# Patient Record
Sex: Male | Born: 1955 | Race: White | Hispanic: No | Marital: Married | Smoking: Never smoker
Health system: Southern US, Community
[De-identification: ages and names within clinical notes are randomized; demographics above are authoritative.]

## PROBLEM LIST (undated history)

## (undated) DIAGNOSIS — J349 Unspecified disorder of nose and nasal sinuses: Secondary | ICD-10-CM

## (undated) DIAGNOSIS — G4730 Sleep apnea, unspecified: Secondary | ICD-10-CM

## (undated) DIAGNOSIS — B019 Varicella without complication: Secondary | ICD-10-CM

## (undated) DIAGNOSIS — T7840XA Allergy, unspecified, initial encounter: Secondary | ICD-10-CM

## (undated) DIAGNOSIS — K635 Polyp of colon: Secondary | ICD-10-CM

## (undated) DIAGNOSIS — M1990 Unspecified osteoarthritis, unspecified site: Secondary | ICD-10-CM

## (undated) DIAGNOSIS — E785 Hyperlipidemia, unspecified: Secondary | ICD-10-CM

## (undated) DIAGNOSIS — K219 Gastro-esophageal reflux disease without esophagitis: Secondary | ICD-10-CM

## (undated) HISTORY — DX: Unspecified disorder of nose and nasal sinuses: J34.9

## (undated) HISTORY — DX: Allergy, unspecified, initial encounter: T78.40XA

## (undated) HISTORY — DX: Polyp of colon: K63.5

## (undated) HISTORY — DX: Gastro-esophageal reflux disease without esophagitis: K21.9

## (undated) HISTORY — DX: Varicella without complication: B01.9

## (undated) HISTORY — PX: OTHER SURGICAL HISTORY: SHX169

## (undated) HISTORY — DX: Unspecified osteoarthritis, unspecified site: M19.90

## (undated) HISTORY — DX: Sleep apnea, unspecified: G47.30

## (undated) HISTORY — DX: Hyperlipidemia, unspecified: E78.5

---

## 2014-11-23 ENCOUNTER — Encounter: Payer: Self-pay | Admitting: Internal Medicine

## 2014-11-23 ENCOUNTER — Other Ambulatory Visit: Payer: BLUE CROSS/BLUE SHIELD

## 2014-11-23 ENCOUNTER — Encounter (INDEPENDENT_AMBULATORY_CARE_PROVIDER_SITE_OTHER): Payer: Self-pay

## 2014-11-23 ENCOUNTER — Ambulatory Visit (INDEPENDENT_AMBULATORY_CARE_PROVIDER_SITE_OTHER): Payer: BLUE CROSS/BLUE SHIELD | Admitting: Internal Medicine

## 2014-11-23 VITALS — BP 108/84 | HR 80 | Ht 71.0 in | Wt 211.0 lb

## 2014-11-23 DIAGNOSIS — K219 Gastro-esophageal reflux disease without esophagitis: Secondary | ICD-10-CM

## 2014-11-23 DIAGNOSIS — G4733 Obstructive sleep apnea (adult) (pediatric): Secondary | ICD-10-CM

## 2014-11-23 DIAGNOSIS — J309 Allergic rhinitis, unspecified: Secondary | ICD-10-CM

## 2014-11-23 DIAGNOSIS — J3089 Other allergic rhinitis: Principal | ICD-10-CM

## 2014-11-23 DIAGNOSIS — Z91018 Allergy to other foods: Secondary | ICD-10-CM

## 2014-11-23 DIAGNOSIS — Z23 Encounter for immunization: Secondary | ICD-10-CM

## 2014-11-23 DIAGNOSIS — Z91038 Other insect allergy status: Secondary | ICD-10-CM | POA: Insufficient documentation

## 2014-11-23 DIAGNOSIS — J302 Other seasonal allergic rhinitis: Secondary | ICD-10-CM

## 2014-11-23 NOTE — Assessment & Plan Note (Signed)
Clarification needed. He attributes significant improvement in upper respiratory symptoms to initiation of allergy vaccine 2 years ago and wishes to continue through this office since [place]. We need clarification from outside records. Plan-we will seek appropriate records and he will bring his remaining vaccine. We can administer outside vaccine here and then replace it with our own based on previous skin testing.

## 2014-11-23 NOTE — Progress Notes (Signed)
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 Carolyne Fiscal for allergies wants to transfer to [facility], Pt also has Sleep Apnea treated by Dr. Enid Skeens.  He has moved from [place] to [place] and is trying to transfer some of his care. Asks help getting a primary physician. In particular he wants to transfer his allergy vaccine management. He is satisfied to continue management of his obstructive sleep apnea through [facility] in [place]. Plan-history of recurrent sinus infections and bronchitis as an adult. He doesn't remember being diagnosed with allergic rhinitis or asthma. He describes "huge" improvement in the 2 years that he has been on allergy vaccine with reduced sinus infections. He does use saline rinse. Allergy skin tested twice, most recently in [place] 2 years ago "reactive to almost everything". Recognize triggers described as fall more than spring pollen season. Dry cough persistent each fall, much worse before he began allergy shots. 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 Has EpiPen. No ENT surgery. Remote nasal fracture did not require resetting. Says he was a premature baby and lungs "never good". Ran track in high school trying to compensate for this. One episode of mild pneumonia. GERD: He did use Prilosec but stopped when he began reading adverse reports about PPI's.  Environment: Lives with wife and 3 dogs church property [place] Would like flu shot  Prior to Admission medications   Medication Sig Start Date End Date Taking? Authorizing Provider  ALOE VERA JUICE PO Take by mouth. Three times daily   Yes Historical Provider, MD  cholecalciferol (VITAMIN D) 1000 UNITS tablet Take 1,000 Units by mouth daily.   Yes Historical Provider, MD  methylphenidate (RITALIN) 10 MG tablet Take 10 mg by mouth.  10/07/14  Yes Historical Provider, MD  NON FORMULARY DGL Licorice - 1 tablet three times daily   Yes Historical Provider, MD  Probiotic Product (PROBIOTIC DAILY PO) Take by mouth. Twice a day   Yes Historical Provider, MD  UNABLE TO FIND Slippery Elm - 2 times daily   Yes Historical Provider, MD  vitamin B-12 (CYANOCOBALAMIN) 100 MCG tablet Take 100 mcg by mouth daily.   Yes Historical Provider, MD   Past Medical History  Diagnosis Date  . Sleep apnea   . Allergy   . Sinus trouble    Past Surgical History  Procedure Laterality Date  . Cartlage removed      Right knee   Family History  Problem Relation Age of Onset  . Diabetes Maternal Aunt   . Lung cancer Father   . Peripheral Artery Disease Mother   . Macular degeneration Mother   . Heart disease Maternal Grandfather   . Heart disease Paternal Grandfather    History   Social History  . Marital Status: Married    Spouse Name: N/A    Number of Children: 1  . Years of Education: N/A   Occupational History  . Minister    Social History Main Topics  . Smoking status: Never Smoker   . Smokeless tobacco: Not on file  . Alcohol Use: 0.0 oz/week    0 Not specified per week     Comment: 2 drinks monthly  . Drug Use: No  . Sexual Activity: Not on file   Other Topics Concern  . Not on file   Social History Narrative  . No narrative on file  ROS-see HPI Constitutional:   No-   weight loss, night sweats, fevers, chills, fatigue, lassitude. HEENT:   No-  headaches, difficulty swallowing, tooth/dental problems, sore throat,      + sneezing, itching, ear ache, +nasal congestion, post nasal drip,  CV:  No-   chest pain, orthopnea, PND, swelling in lower extremities, anasarca,                                  dizziness, palpitations Resp: No-   shortness of breath with exertion or at rest.              No-   productive cough,  + non-productive cough,  No- coughing up of blood.              No-   change in color of mucus.  No-  wheezing.   Skin: No-   rash or lesions. GI:  No-   heartburn, indigestion, abdominal pain, nausea, vomiting, diarrhea,                 change in bowel habits, loss of appetite GU: No-   dysuria, change in color of urine, no urgency or frequency.  No- flank pain. MS:  No-   joint pain or swelling.  No- decreased range of motion.  No- back pain. Neuro-     nothing unusual Psych:  No- change in mood or affect. No depression or anxiety.  No memory loss.  OBJ- Physical Exam General- Alert, Oriented, Affect-appropriate, Distress- none acute Skin- rash-none, lesions- none, excoriation- none Lymphadenopathy- none Head- atraumatic            Eyes- Gross vision intact, PERRLA, conjunctivae and secretions clear            Ears- Hearing, canals-normal            Nose- Clear, no-Septal dev, mucus, polyps, erosion, perforation             Throat- Mallampati II , mucosa clear , drainage- none, tonsils- atrophic Neck- flexible , trachea midline, no stridor , thyroid nl, carotid no bruit Chest - symmetrical excursion , unlabored           Heart/CV- RRR , no murmur , no gallop  , no rub, nl s1 s2                           - JVD- none , edema- none, stasis changes- none, varices- none           Lung- clear to P&A, wheeze- none, cough- none , dullness-none, rub- none           Chest wall-  Abd- tender-no, distended-no, bowel sounds-present, HSM- no Br/ Gen/ Rectal- Not done, not indicated Extrem- cyanosis- none, clubbing, none, atrophy- none, strength- nl Neuro- grossly intact to observation

## 2014-11-23 NOTE — Assessment & Plan Note (Signed)
CPAP unknown pressure/Aero care managed through [facility] [place]. He feels adequately controlled and managed for now. He is not seeking to transfer that care now.

## 2014-11-23 NOTE — Assessment & Plan Note (Signed)
He describes anaphylactic reaction to yellow jacket sting with throat tightness and shortness of breath for which she went to emergency room twice. He has EpiPen. Plan-educated on H1 and H2 antihistamines, stinging insect allergy profile

## 2014-11-23 NOTE — Assessment & Plan Note (Signed)
He thinks certain foods cause sneezing and nasal congestion but he is unclear which foods. Plan-food IgE profile

## 2014-11-23 NOTE — Patient Instructions (Addendum)
We will need from your current allergy doctor: Your skin test record The list of what is in your allergy vaccine The bottles of remaining vaccine- Unless you prefer to finish those up at your current office.  When your current vaccine supply is used up, we will make a vaccine here based on what you have been getting.  Zantac, Tagamet and Pepcid are otc antihistamines (H2) that are marketed as acid blockers, but don't work by the same chemistry as the Proton Pump Inhibitors.  Consider trying a brick under each of the head legs of your bed frame to reduce reflux at night.  Order-lab- Hymenoptera/ Stinging Insect Profile     Dx Allergic Rhinitis                   Food IgE profile  Order- [facility]- Please try referral to establish with Primary Care   Order- flu vax

## 2014-11-23 NOTE — Assessment & Plan Note (Signed)
History of reflux. He had been well-controlled with Prilosec but stopped that some months ago after reading about concerns with long-term use of PPIs. Plan-educated on use of H2 antihistamines, elevation of head of bed, reflux precautions instead of depending on PPI. He needs to establish  primary physician and that we'll help with long-term management decisions.

## 2014-11-24 LAB — ALLERGEN HYMENOPTERA PANEL
ALLERGEN PAPER WASP: 1.45 kU/L — AB
ALLERGEN WHITE HORNET: 1.22 kU/L — AB
ALLERGEN YELLOW JACKET: 3.37 kU/L — AB
Honey Bee IgE: 0.38 kU/L — ABNORMAL HIGH
Yellow Hornet IgE: 0.61 kU/L — ABNORMAL HIGH

## 2014-11-24 LAB — ALLERGEN FOOD PROFILE SPECIFIC IGE
Apple: <0.1 kU/L
Chicken IgE: <0.1 kU/L
Egg White IgE: <0.1 kU/L
Fish Cod: <0.1 kU/L
IgE (Immunoglobulin E), Serum: 42 kU/L (ref <115)
Milk IgE: 0.39 kU/L — ABNORMAL HIGH
Orange: <0.1 kU/L
Shrimp IgE: <0.1 kU/L
Soybean IgE: <0.1 kU/L
Tuna IgE: <0.1 kU/L

## 2014-12-24 ENCOUNTER — Ambulatory Visit (INDEPENDENT_AMBULATORY_CARE_PROVIDER_SITE_OTHER): Payer: BLUE CROSS/BLUE SHIELD | Admitting: Family

## 2014-12-24 ENCOUNTER — Encounter: Payer: Self-pay | Admitting: Family

## 2014-12-24 ENCOUNTER — Other Ambulatory Visit: Payer: Self-pay | Admitting: Family

## 2014-12-24 VITALS — BP 132/88 | HR 70 | Temp 98.2°F | Resp 18 | Ht 71.0 in | Wt 209.8 lb

## 2014-12-24 DIAGNOSIS — J309 Allergic rhinitis, unspecified: Secondary | ICD-10-CM

## 2014-12-24 DIAGNOSIS — Z0000 Encounter for general adult medical examination without abnormal findings: Secondary | ICD-10-CM

## 2014-12-24 DIAGNOSIS — K219 Gastro-esophageal reflux disease without esophagitis: Secondary | ICD-10-CM

## 2014-12-24 DIAGNOSIS — J302 Other seasonal allergic rhinitis: Secondary | ICD-10-CM

## 2014-12-24 DIAGNOSIS — E785 Hyperlipidemia, unspecified: Secondary | ICD-10-CM | POA: Insufficient documentation

## 2014-12-24 DIAGNOSIS — J3089 Other allergic rhinitis: Principal | ICD-10-CM

## 2014-12-24 NOTE — Assessment & Plan Note (Signed)
Previously noted high cholesterol readings. Will obtain new lipid profile during physical. Continue current lifestyle treatments.

## 2014-12-24 NOTE — Patient Instructions (Addendum)
Thank you for choosing [facility].  Summary/Instructions:  Dr. Ottis StainJoel Furhman "Eat to Live"  Please continue to take the herbal remedies for GERD.   Please schedule a time for your physical.   If your symptoms worsen or fail to improve, please contact our office for further instruction, or in case of emergency go directly to the emergency room at the closest medical facility.    Food Choices for Gastroesophageal Reflux Disease When you have gastroesophageal reflux disease (GERD), the foods you eat and your eating habits are very important. Choosing the right foods can help ease the discomfort of GERD. WHAT GENERAL GUIDELINES DO I NEED TO FOLLOW?  Choose fruits, vegetables, whole grains, low-fat dairy products, and low-fat meat, fish, and poultry.  Limit fats such as oils, salad dressings, butter, nuts, and avocado.  Keep a food diary to identify foods that cause symptoms.  Avoid foods that cause reflux. These may be different for different people.  Eat frequent small meals instead of three large meals each day.  Eat your meals slowly, in a relaxed setting.  Limit fried foods.  Cook foods using methods other than frying.  Avoid drinking alcohol.  Avoid drinking large amounts of liquids with your meals.  Avoid bending over or lying down until 2-3 hours after eating. WHAT FOODS ARE NOT RECOMMENDED? The following are some foods and drinks that may worsen your symptoms: Vegetables Tomatoes. Tomato juice. Tomato and spaghetti sauce. Chili peppers. Onion and garlic. Horseradish. Fruits Oranges, grapefruit, and lemon (fruit and juice). Meats High-fat meats, fish, and poultry. This includes hot dogs, ribs, ham, sausage, salami, and bacon. Dairy Whole milk and chocolate milk. Sour cream. Cream. Butter. Ice cream. Cream cheese.  Beverages Coffee and tea, with or without caffeine. Carbonated beverages or energy drinks. Condiments Hot sauce. Barbecue sauce.   Sweets/Desserts Chocolate and cocoa. Donuts. Peppermint and spearmint. Fats and Oils High-fat foods, including Jamaica[place] fries and potato chips. Other Vinegar. Strong spices, such as black pepper, white pepper, red pepper, cayenne, curry powder, cloves, ginger, and chili powder. The items listed above may not be a complete list of foods and beverages to avoid. Contact your dietitian for more information. Document Released: 10/22/2005 Document Revised: 10/27/2013 Document Reviewed: 08/26/2013 [facility] Patient Information 2015 [place] [place]. This information is not intended to replace advice given to you by your health care provider. Make sure you discuss any questions you have with your health care provider.

## 2014-12-24 NOTE — Assessment & Plan Note (Signed)
Patient continues to experience some reflux off of his Prilosec. Continue herbal medications as needed. Discussed with patient that herbal remedies are not regulated by the FDA. Continue ranitidine at previously prescribed dose. Patient provided handout regarding GERD foods.

## 2014-12-24 NOTE — Progress Notes (Signed)
Pre visit review using our clinic review tool, if applicable. No additional management support is needed unless otherwise documented below in the visit note. 

## 2014-12-24 NOTE — Assessment & Plan Note (Signed)
Stable and maintained with allergist and Dr. Maple HudsonYoung.

## 2014-12-24 NOTE — Progress Notes (Signed)
Subjective:    Patient ID: William Bauer, male    DOB: 02-26-1956, 59 y.o.   MRN: [id number]  Chief Complaint  Patient presents with  . Establish Care    would like to dicuss acid reflux and thinks he may have a high cholesterol    HPI:  William Bauer is a 59 y.o. male who presents today to establish care and discuss acid reflux and cholesterol.  1) Acid reflux - Associated symptom of acid reflux has been going on for about 14 years. Has previously taken prilosec which was effective, however recently discontinued to trial homeopathic medications. Is currently taking ranitidine which also seems to help. Describes non-pharmacology of decreasing eating late at night, spicy foods, and sitting up after meals. Indicates that it is fai  2) Cholesterol - indicates he has previously had high cholesterol reading. Is not current maintained on any medications.  3) Seasonal allergies - currently stable and maintained with allergy shots and Dr. Maple Hudson of Pulmonology.   Allergies  Allergen Reactions  . Penicillins     Unknown reaction  . Yellow Jacket Venom [Bee Venom]     Current Outpatient Prescriptions on File Prior to Visit  Medication Sig Dispense Refill  . ALOE VERA JUICE PO Take by mouth. Three times daily    . cholecalciferol (VITAMIN D) 1000 UNITS tablet Take 1,000 Units by mouth daily.    . methylphenidate (RITALIN) 10 MG tablet Take 10 mg by mouth.    . NON FORMULARY DGL Licorice - 1 tablet three times daily    . Probiotic Product (PROBIOTIC DAILY PO) Take by mouth. Twice a day    . UNABLE TO FIND Slippery Elm - 2 times daily    . vitamin B-12 (CYANOCOBALAMIN) 100 MCG tablet Take 100 mcg by mouth daily.     No current facility-administered medications on file prior to visit.    Past Medical History  Diagnosis Date  . Sleep apnea   . Allergy   . Sinus trouble   . Arthritis     hands   . Chicken pox   . GERD (gastroesophageal reflux disease)   . Hyperlipidemia   .  Colon polyps     Past Surgical History  Procedure Laterality Date  . Cartlage removed      Right knee    Family History  Problem Relation Age of Onset  . Diabetes Maternal Aunt   . Breast cancer Maternal Aunt   . Lung cancer Father   . Hyperlipidemia Father   . Peripheral Artery Disease Mother   . Macular degeneration Mother   . Arthritis Mother   . Hyperlipidemia Mother   . Stroke Mother   . Hypertension Mother   . Heart disease Maternal Grandfather   . Hyperlipidemia Maternal Grandfather   . Heart disease Paternal Grandfather   . Hyperlipidemia Paternal Grandfather   . Alcohol abuse Maternal Uncle     History   Social History  . Marital Status: Married    Spouse Name: N/A  . Number of Children: 1  . Years of Education: 39   Occupational History  . Minister    Social History Main Topics  . Smoking status: Never Smoker   . Smokeless tobacco: Never Used  . Alcohol Use: 0.0 oz/week    0 Standard drinks or equivalent per week     Comment: 2 drinks monthly  . Drug Use: No  . Sexual Activity: Not on file   Other Topics Concern  . Not  on file   Social History Narrative   [place] Currently resides in a house. 3 dogs. Fun: Yardwork, golf, read   Denies any religious beliefs effecting health care.     Review of Systems  Respiratory: Negative for cough, chest tightness and shortness of breath.   Cardiovascular: Negative for chest pain, palpitations and leg swelling.  Neurological: Negative for headaches.      Objective:    BP 132/88 mmHg  Pulse 70  Temp(Src) 98.2 F (36.8 C) (Oral)  Resp 18  Ht 5\' 11"  (1.803 m)  Wt 209 lb 12.8 oz (95.165 kg)  BMI 29.27 kg/m2  SpO2 96% Nursing note and vital signs reviewed.  Physical Exam  Constitutional: He is oriented to person, place, and time. He appears well-developed and well-nourished. No distress.  Cardiovascular: Normal rate, regular rhythm, normal heart sounds and intact distal  pulses.   Pulmonary/Chest: Effort normal and breath sounds normal.  Neurological: He is alert and oriented to person, place, and time.  Skin: Skin is warm and dry.  Psychiatric: He has a normal mood and affect. His behavior is normal. Judgment and thought content normal.       Assessment & Plan:

## 2014-12-30 ENCOUNTER — Encounter: Payer: Self-pay | Admitting: Family

## 2014-12-30 ENCOUNTER — Ambulatory Visit (INDEPENDENT_AMBULATORY_CARE_PROVIDER_SITE_OTHER): Payer: BLUE CROSS/BLUE SHIELD | Admitting: Family

## 2014-12-30 ENCOUNTER — Other Ambulatory Visit (INDEPENDENT_AMBULATORY_CARE_PROVIDER_SITE_OTHER): Payer: BLUE CROSS/BLUE SHIELD

## 2014-12-30 VITALS — BP 140/102 | HR 77 | Temp 98.1°F | Wt 213.0 lb

## 2014-12-30 DIAGNOSIS — J019 Acute sinusitis, unspecified: Secondary | ICD-10-CM

## 2014-12-30 DIAGNOSIS — Z0000 Encounter for general adult medical examination without abnormal findings: Secondary | ICD-10-CM

## 2014-12-30 DIAGNOSIS — J0110 Acute frontal sinusitis, unspecified: Secondary | ICD-10-CM | POA: Insufficient documentation

## 2014-12-30 DIAGNOSIS — E785 Hyperlipidemia, unspecified: Secondary | ICD-10-CM

## 2014-12-30 LAB — LIPID PANEL
CHOL/HDL RATIO: 4
Cholesterol: 193 mg/dL (ref 0–200)
HDL: 45.3 mg/dL (ref >39.00)
LDL Cholesterol: 112 mg/dL — ABNORMAL HIGH (ref 0–99)
NONHDL: 147.7
Triglycerides: 179 mg/dL — ABNORMAL HIGH (ref 0.0–149.0)
VLDL: 35.8 mg/dL (ref 0.0–40.0)

## 2014-12-30 LAB — BASIC METABOLIC PANEL
BUN: 13 mg/dL (ref 6–23)
CHLORIDE: 105 meq/L (ref 96–112)
CO2: 30 mEq/L (ref 19–32)
Calcium: 9.3 mg/dL (ref 8.4–10.5)
Creatinine, Ser: 1.14 mg/dL (ref 0.40–1.50)
GFR: 70.44 mL/min (ref >60.00)
Glucose, Bld: 90 mg/dL (ref 70–99)
Potassium: 4.3 mEq/L (ref 3.5–5.1)
Sodium: 139 mEq/L (ref 135–145)

## 2014-12-30 LAB — CBC
HCT: 44.9 % (ref 39.0–52.0)
Hemoglobin: 15.4 g/dL (ref 13.0–17.0)
MCHC: 34.2 g/dL (ref 30.0–36.0)
MCV: 83.6 fl (ref 78.0–100.0)
Platelets: 252 10*3/uL (ref 150.0–400.0)
RBC: 5.37 Mil/uL (ref 4.22–5.81)
RDW: 13.3 % (ref 11.5–15.5)
WBC: 8.2 10*3/uL (ref 4.0–10.5)

## 2014-12-30 LAB — TSH: TSH: 2.1 u[IU]/mL (ref 0.35–4.50)

## 2014-12-30 MED ORDER — MOMETASONE FUROATE 50 MCG/ACT NA SUSP: 2.0000 | Freq: Every day | NASAL | Status: DC

## 2014-12-30 MED ORDER — DOXYCYCLINE HYCLATE 100 MG PO TABS: 100.0000 mg | ORAL_TABLET | Freq: Two times a day (BID) | ORAL | Status: DC

## 2014-12-30 NOTE — Progress Notes (Signed)
Pre visit review using our clinic review tool, if applicable. No additional management support is needed unless otherwise documented below in the visit note. 

## 2014-12-30 NOTE — Assessment & Plan Note (Signed)
Symptoms and exam consistent with bacterial sinusitis. Start doxycycline. Start Nasonex. Continue over-the-counter medications as needed for symptom relief. Follow-up if symptoms worsen or fail to improve.

## 2014-12-30 NOTE — Patient Instructions (Addendum)
Thank you for choosing [facility].  Summary/Instructions:  Your prescription(s) have been submitted to your pharmacy or been printed and provided for you. Please take as directed and contact our office if you believe you are having problem(s) with the medication(s) or have any questions.  If your symptoms worsen or fail to improve, please contact our office for further instruction, or in case of emergency go directly to the emergency room at the closest medical facility.   Sinusitis Sinusitis is redness, soreness, and inflammation of the paranasal sinuses. Paranasal sinuses are air pockets within the bones of your face (beneath the eyes, the middle of the forehead, or above the eyes). In healthy paranasal sinuses, mucus is able to drain out, and air is able to circulate through them by way of your nose. However, when your paranasal sinuses are inflamed, mucus and air can become trapped. This can allow bacteria and other germs to grow and cause infection. Sinusitis can develop quickly and last only a short time (acute) or continue over a long period (chronic). Sinusitis that lasts for more than 12 weeks is considered chronic.  CAUSES  Causes of sinusitis include:  Allergies.  Structural abnormalities, such as displacement of the cartilage that separates your nostrils (deviated septum), which can decrease the air flow through your nose and sinuses and affect sinus drainage.  Functional abnormalities, such as when the small hairs (cilia) that line your sinuses and help remove mucus do not work properly or are not present. SIGNS AND SYMPTOMS  Symptoms of acute and chronic sinusitis are the same. The primary symptoms are pain and pressure around the affected sinuses. Other symptoms include:  Upper toothache.  Earache.  Headache.  Bad breath.  Decreased sense of smell and taste.  A cough, which worsens when you are lying flat.  Fatigue.  Fever.  Thick drainage from your nose,  which often is green and may contain pus (purulent).  Swelling and warmth over the affected sinuses. DIAGNOSIS  Your health care provider will perform a physical exam. During the exam, your health care provider may:  Look in your nose for signs of abnormal growths in your nostrils (nasal polyps).  Tap over the affected sinus to check for signs of infection.  View the inside of your sinuses (endoscopy) using an imaging device that has a light attached (endoscope). If your health care provider suspects that you have chronic sinusitis, one or more of the following tests may be recommended:  Allergy tests.  Nasal culture. A sample of mucus is taken from your nose, sent to a lab, and screened for bacteria.  Nasal cytology. A sample of mucus is taken from your nose and examined by your health care provider to determine if your sinusitis is related to an allergy. TREATMENT  Most cases of acute sinusitis are related to a viral infection and will resolve on their own within 10 days. Sometimes medicines are prescribed to help relieve symptoms (pain medicine, decongestants, nasal steroid sprays, or saline sprays).  However, for sinusitis related to a bacterial infection, your health care provider will prescribe antibiotic medicines. These are medicines that will help kill the bacteria causing the infection.  Rarely, sinusitis is caused by a fungal infection. In theses cases, your health care provider will prescribe antifungal medicine. For some cases of chronic sinusitis, surgery is needed. Generally, these are cases in which sinusitis recurs more than 3 times per year, despite other treatments. HOME CARE INSTRUCTIONS   Drink plenty of water. Water helps   thin the mucus so your sinuses can drain more easily.  Use a humidifier.  Inhale steam 3 to 4 times a day (for example, sit in the bathroom with the shower running).  Apply a warm, moist washcloth to your face 3 to 4 times a day, or as directed  by your health care provider.  Use saline nasal sprays to help moisten and clean your sinuses.  Take medicines only as directed by your health care provider.  If you were prescribed either an antibiotic or antifungal medicine, finish it all even if you start to feel better. SEEK IMMEDIATE MEDICAL CARE IF:  You have increasing pain or severe headaches.  You have nausea, vomiting, or drowsiness.  You have swelling around your face.  You have vision problems.  You have a stiff neck.  You have difficulty breathing. MAKE SURE YOU:   Understand these instructions.  Will watch your condition.  Will get help right away if you are not doing well or get worse. Document Released: 10/22/2005 Document Revised: 03/08/2014 Document Reviewed: 11/06/2011 ExitCare Patient Information 2015 [employer]. This information is not intended to replace advice given to you by your health care provider. Make sure you discuss any questions you have with your health care provider.   

## 2014-12-30 NOTE — Progress Notes (Signed)
   Subjective:    Patient ID: William Bauer, male    DOB: 1956-02-13, 59 y.o.   MRN: [id number]   Chief Complaint  Patient presents with  . Sinusitis    HPI:  William Headlandnthony Prieto is a 59 y.o. male who presents today for an acute visit.  This is new problem. Associated symptoms of headache, congestion, purulent mucus, fever, malaise, sinus pressure and sore throat going on for about about a week. Has used sinus rinse which has helped minimally. Indicates that he has worsened over the course of week. Denies recent antibiotic use.   Allergies  Allergen Reactions  . Penicillins     Unknown reaction  . Yellow Jacket Venom [Bee Venom]     Current Outpatient Prescriptions on File Prior to Visit  Medication Sig Dispense Refill  . ALOE VERA JUICE PO Take by mouth. Three times daily    . cholecalciferol (VITAMIN D) 1000 UNITS tablet Take 1,000 Units by mouth daily.    . methylphenidate (RITALIN) 10 MG tablet Take 10 mg by mouth.    . NON FORMULARY DGL Licorice - 1 tablet three times daily    . Probiotic Product (PROBIOTIC DAILY PO) Take by mouth. Twice a day    . ranitidine (ZANTAC) 150 MG tablet Take 150 mg by mouth 2 (two) times daily.    Marland Kitchen. UNABLE TO FIND Slippery Elm - 2 times daily    . vitamin B-12 (CYANOCOBALAMIN) 100 MCG tablet Take 100 mcg by mouth daily.     No current facility-administered medications on file prior to visit.     Review of Systems  Constitutional: Positive for fever.  HENT: Positive for congestion, sinus pressure and sore throat.   Respiratory: Positive for cough. Negative for chest tightness and shortness of breath.   Neurological: Positive for headaches.      Objective:    BP 140/102 mmHg  Pulse 77  Temp(Src) 98.1 F (36.7 C) (Oral)  Wt 213 lb (96.616 kg)  SpO2 93% Nursing note and vital signs reviewed.   Physical Exam  Constitutional: He is oriented to person, place, and time. He appears well-developed and well-nourished. No distress.  HENT:    Right Ear: Hearing, tympanic membrane, external ear and ear canal normal.  Left Ear: Hearing, tympanic membrane, external ear and ear canal normal.  Nose: Right sinus exhibits maxillary sinus tenderness and frontal sinus tenderness. Left sinus exhibits maxillary sinus tenderness and frontal sinus tenderness.  Mouth/Throat: Uvula is midline, oropharynx is clear and moist and mucous membranes are normal.  Neck: Neck supple.  Cardiovascular: Normal rate, regular rhythm, normal heart sounds and intact distal pulses.   Pulmonary/Chest: Effort normal and breath sounds normal.  Lymphadenopathy:    He has no cervical adenopathy.  Neurological: He is alert and oriented to person, place, and time.  Skin: Skin is warm and dry.  Psychiatric: He has a normal mood and affect. His behavior is normal. Judgment and thought content normal.       Assessment & Plan:

## 2014-12-31 ENCOUNTER — Encounter: Payer: BLUE CROSS/BLUE SHIELD | Admitting: Family

## 2014-12-31 ENCOUNTER — Telehealth: Payer: Self-pay | Admitting: Family

## 2014-12-31 NOTE — Telephone Encounter (Signed)
Please inform the patient that his lab work is all within the expected ranges. His LDL or bad cholesterol and Triglycerides are slightly elevated, but nothing that requires any action at this time.

## 2014-12-31 NOTE — Telephone Encounter (Signed)
Left mess for patient to call back.  

## 2015-01-03 NOTE — Telephone Encounter (Signed)
Pt is aware of results. Would like PSA done at next visit if possible.

## 2015-01-04 NOTE — Telephone Encounter (Signed)
Noted  

## 2015-01-07 ENCOUNTER — Ambulatory Visit (INDEPENDENT_AMBULATORY_CARE_PROVIDER_SITE_OTHER): Payer: BLUE CROSS/BLUE SHIELD | Admitting: Family

## 2015-01-07 ENCOUNTER — Encounter: Payer: Self-pay | Admitting: Family

## 2015-01-07 VITALS — BP 142/90 | HR 74 | Temp 97.7°F | Resp 18 | Ht 71.0 in | Wt 214.4 lb

## 2015-01-07 DIAGNOSIS — Z23 Encounter for immunization: Secondary | ICD-10-CM | POA: Diagnosis not present

## 2015-01-07 DIAGNOSIS — Z0000 Encounter for general adult medical examination without abnormal findings: Secondary | ICD-10-CM | POA: Diagnosis not present

## 2015-01-07 NOTE — Progress Notes (Signed)
Subjective:    Patient ID: William Bauer, male    DOB: 07-May-1956, 59 y.o.   MRN: [id number]  Chief Complaint  Patient presents with  . CPE    Not fasting, had blood work done    HPI:  William Bauer is a 59 y.o. male who presents today for an annual wellness visit.   1) Health Maintenance -   Diet - Averages 3 meals per day; fair amounts of fruits and vegetables, lean meats; Limited processed/fast food. Average about 3 cups of caffeine per day.   Exercise - Resistance training / Cardio / Play golf; 4x per week.   2) Preventative Exams / Immunizations:  Dental -- Up to date Vision -- Up to date   Health Maintenance  Topic Date Due  . HIV Screening  04/01/1973  . TETANUS/TDAP  04/01/1977  . INFLUENZA VACCINE  06/06/2015  . COLONOSCOPY  11/19/2021  Teatnus shot.    Immunization History  Administered Date(s) Administered  . Influenza,inj,Quad PF,36+ Mos 11/23/2014    Review of Systems  Constitutional: Denies fever, chills, fatigue, or significant weight gain/loss. HENT: Head: Denies headache or neck pain Ears: Denies changes in hearing, ringing in ears, earache, drainage Nose: Denies discharge, stuffiness, itching, nosebleed, sinus pain Throat: Denies sore throat, hoarseness, dry mouth, sores, thrush Eyes: Denies loss/changes in vision, pain, redness, blurry/double vision, flashing lights Cardiovascular: Denies chest pain/discomfort, tightness, palpitations, shortness of breath with activity, difficulty lying down, swelling, sudden awakening with shortness of breath Respiratory: Denies shortness of breath, cough, sputum production, wheezing Gastrointestinal: Denies dysphasia, heartburn, change in appetite, nausea, change in bowel habits, rectal bleeding, constipation, diarrhea, yellow skin or eyes Genitourinary: Denies frequency, urgency, burning/pain, blood in urine, incontinence, change in urinary strength. Musculoskeletal: Denies muscle/joint pain,  stiffness, back pain, redness or swelling of joints, trauma Skin: Denies rashes, lumps, itching, dryness, color changes, or hair/nail changes Neurological: Denies dizziness, fainting, seizures, weakness, numbness, tingling, tremor Psychiatric - Denies nervousness, stress, depression or memory loss Endocrine: Denies heat or cold intolerance, sweating, frequent urination, excessive thirst, changes in appetite Hematologic: Denies ease of bruising or bleeding     Objective:    BP 142/90 mmHg  Pulse 74  Temp(Src) 97.7 F (36.5 C) (Oral)  Resp 18  Ht  (1.803 m)  Wt 214 lb 6.4 oz (97.251 kg)  BMI 29.92 kg/m2  SpO2 95% Nursing note and vital signs reviewed.  Physical Exam  Constitutional: He is oriented to person, place, and time. He appears well-developed and well-nourished.  HENT:  Head: Normocephalic.  Right Ear: Hearing, tympanic membrane, external ear and ear canal normal.  Left Ear: Hearing, tympanic membrane, external ear and ear canal normal.  Nose: Nose normal.  Mouth/Throat: Uvula is midline, oropharynx is clear and moist and mucous membranes are normal.  Eyes: Conjunctivae and EOM are normal. Pupils are equal, round, and reactive to light.  Neck: Neck supple. No JVD present. No tracheal deviation present. No thyromegaly present.  Cardiovascular: Normal rate, regular rhythm, normal heart sounds and intact distal pulses.   Pulmonary/Chest: Effort normal and breath sounds normal.  Abdominal: Soft. Bowel sounds are normal. He exhibits no distension and no mass. There is no tenderness. There is no rebound and no guarding.  Musculoskeletal: Normal range of motion. He exhibits no edema or tenderness.  Lymphadenopathy:    He has no cervical adenopathy.  Neurological: He is alert and oriented to person, place, and time. He has normal reflexes. No cranial nerve deficit. He exhibits normal  muscle tone. Coordination normal.  Skin: Skin is warm and dry.  Psychiatric: He has a  normal mood and affect. His behavior is normal. Judgment and thought content normal.       Assessment & Plan:

## 2015-01-07 NOTE — Patient Instructions (Signed)
Thank you for choosing [facility].  Summary/Instructions:   Please stop by the lab on the basement level of the building for your blood work. Your results will be released to MyChart (or called to you) after review, usually within 72 hours after test completion. If any changes need to be made, you will be notified at that same time.  Health Maintenance A healthy lifestyle and preventative care can promote health and wellness.  Maintain regular health, dental, and eye exams.  Eat a healthy diet. Foods like vegetables, fruits, whole grains, low-fat dairy products, and lean protein foods contain the nutrients you need and are low in calories. Decrease your intake of foods high in solid fats, added sugars, and salt. Get information about a proper diet from your health care provider, if necessary.  Regular physical exercise is one of the most important things you can do for your health. Most adults should get at least 150 minutes of moderate-intensity exercise (any activity that increases your heart rate and causes you to sweat) each week. In addition, most adults need muscle-strengthening exercises on 2 or more days a week.   Maintain a healthy weight. The body mass index (BMI) is a screening tool to identify possible weight problems. It provides an estimate of body fat based on height and weight. Your health care provider can find your BMI and can help you achieve or maintain a healthy weight. For males 20 years and older:  A BMI below 18.5 is considered underweight.  A BMI of 18.5 to 24.9 is normal.  A BMI of 25 to 29.9 is considered overweight.  A BMI of 30 and above is considered obese.  Maintain normal blood lipids and cholesterol by exercising and minimizing your intake of saturated fat. Eat a balanced diet with plenty of fruits and vegetables. Blood tests for lipids and cholesterol should begin at age 20 and be repeated every 5 years. If your lipid or cholesterol levels are  high, you are over age 50, or you are at high risk for heart disease, you may need your cholesterol levels checked more frequently.Ongoing high lipid and cholesterol levels should be treated with medicines if diet and exercise are not working.  If you smoke, find out from your health care provider how to quit. If you do not use tobacco, do not start.  Lung cancer screening is recommended for adults aged 55-80 years who are at high risk for developing lung cancer because of a history of smoking. A yearly low-dose CT scan of the lungs is recommended for people who have at least a 30-pack-year history of smoking and are current smokers or have quit within the past 15 years. A pack year of smoking is smoking an average of 1 pack of cigarettes a day for 1 year (for example, a 30-pack-year history of smoking could mean smoking 1 pack a day for 30 years or 2 packs a day for 15 years). Yearly screening should continue until the smoker has stopped smoking for at least 15 years. Yearly screening should be stopped for people who develop a health problem that would prevent them from having lung cancer treatment.  If you choose to drink alcohol, do not have more than 2 drinks per day. One drink is considered to be 12 oz (360 mL) of beer, 5 oz (150 mL) of wine, or 1.5 oz (45 mL) of liquor.  Avoid the use of street drugs. Do not share needles with anyone. Ask for help if you need   support or instructions about stopping the use of drugs.  High blood pressure causes heart disease and increases the risk of stroke. Blood pressure should be checked at least every 1-2 years. Ongoing high blood pressure should be treated with medicines if weight loss and exercise are not effective.  If you are 45-79 years old, ask your health care provider if you should take aspirin to prevent heart disease.  Diabetes screening involves taking a blood sample to check your fasting blood sugar level. This should be done once every 3 years  after age 45 if you are at a normal weight and without risk factors for diabetes. Testing should be considered at a younger age or be carried out more frequently if you are overweight and have at least 1 risk factor for diabetes.  Colorectal cancer can be detected and often prevented. Most routine colorectal cancer screening begins at the age of 50 and continues through age 75. However, your health care provider may recommend screening at an earlier age if you have risk factors for colon cancer. On a yearly basis, your health care provider may provide home test kits to check for hidden blood in the stool. A small camera at the end of a tube may be used to directly examine the colon (sigmoidoscopy or colonoscopy) to detect the earliest forms of colorectal cancer. Talk to your health care provider about this at age 50 when routine screening begins. A direct exam of the colon should be repeated every 5-10 years through age 75, unless early forms of precancerous polyps or small growths are found.  People who are at an increased risk for hepatitis B should be screened for this virus. You are considered at high risk for hepatitis B if:  You were born in a country where hepatitis B occurs often. Talk with your health care provider about which countries are considered high risk.  Your parents were born in a high-risk country and you have not received a shot to protect against hepatitis B (hepatitis B vaccine).  You have HIV or AIDS.  You use needles to inject street drugs.  You live with, or have sex with, someone who has hepatitis B.  You are a man who has sex with other men (MSM).  You get hemodialysis treatment.  You take certain medicines for conditions like cancer, organ transplantation, and autoimmune conditions.  Hepatitis C blood testing is recommended for all people born from 1945 through 1965 and any individual with known risk factors for hepatitis C.  Healthy men should no longer receive  prostate-specific antigen (PSA) blood tests as part of routine cancer screening. Talk to your health care provider about prostate cancer screening.  Testicular cancer screening is not recommended for adolescents or adult males who have no symptoms. Screening includes self-exam, a health care provider exam, and other screening tests. Consult with your health care provider about any symptoms you have or any concerns you have about testicular cancer.  Practice safe sex. Use condoms and avoid high-risk sexual practices to reduce the spread of sexually transmitted infections (STIs).  You should be screened for STIs, including gonorrhea and chlamydia if:  You are sexually active and are younger than 24 years.  You are older than 24 years, and your health care provider tells you that you are at risk for this type of infection.  Your sexual activity has changed since you were last screened, and you are at an increased risk for chlamydia or gonorrhea. Ask your health care   provider if you are at risk.  If you are at risk of being infected with HIV, it is recommended that you take a prescription medicine daily to prevent HIV infection. This is called pre-exposure prophylaxis (PrEP). You are considered at risk if:  You are a man who has sex with other men (MSM).  You are a heterosexual man who is sexually active with multiple partners.  You take drugs by injection.  You are sexually active with a partner who has HIV.  Talk with your health care provider about whether you are at high risk of being infected with HIV. If you choose to begin PrEP, you should first be tested for HIV. You should then be tested every 3 months for as long as you are taking PrEP.  Use sunscreen. Apply sunscreen liberally and repeatedly throughout the day. You should seek shade when your shadow is shorter than you. Protect yourself by wearing long sleeves, pants, a wide-brimmed hat, and sunglasses year round whenever you are  outdoors.  Tell your health care provider of new moles or changes in moles, especially if there is a change in shape or color. Also, tell your health care provider if a mole is larger than the size of a pencil eraser.  A one-time screening for abdominal aortic aneurysm (AAA) and surgical repair of large AAAs by ultrasound is recommended for men aged 65-75 years who are current or former smokers.  Stay current with your vaccines (immunizations). Document Released: 04/19/2008 Document Revised: 10/27/2013 Document Reviewed: 03/19/2011 ExitCare Patient Information 2015 [employer]. This information is not intended to replace advice given to you by your health care provider. Make sure you discuss any questions you have with your health care provider.   

## 2015-01-07 NOTE — Addendum Note (Signed)
Addended by: Mercer PodWRENN, Ayame Rena E on: 01/07/2015 03:40 PM   Modules accepted: Orders

## 2015-01-07 NOTE — Progress Notes (Signed)
Pre visit review using our clinic review tool, if applicable. No additional management support is needed unless otherwise documented below in the visit note. 

## 2015-01-07 NOTE — Assessment & Plan Note (Signed)
1) Anticipatory Guidance: Discussed importance of wearing a seatbelt while driving and not texting while driving; changing batteries in smoke detector at least once annually; wearing suntan lotion when outside; eating a balanced and moderate diet; getting physical activity at least 30 minutes per day.  2) Immunizations / Screenings / Labs:  Tetanus shot given today. All other immunizations are up-to-date per recommendations. Obtain PSA. All other screenings are up-to-date per recommendations. Labwork was previously completed.  Overall well exam. Patient has minimal risk factors at this time. Discussed risk factor of being overweight. Recommend decreasing 5-10% of body weight through increased nutrient density and increased physical activity. Reviewed blood work with patient. All blood work appears to be within normal limits. Follow-up prevention exam in one year.

## 2015-01-18 ENCOUNTER — Ambulatory Visit (INDEPENDENT_AMBULATORY_CARE_PROVIDER_SITE_OTHER): Payer: BLUE CROSS/BLUE SHIELD | Admitting: Internal Medicine

## 2015-01-18 ENCOUNTER — Encounter: Payer: Self-pay | Admitting: Internal Medicine

## 2015-01-18 VITALS — BP 122/70 | HR 79 | Ht 71.0 in | Wt 209.4 lb

## 2015-01-18 DIAGNOSIS — J309 Allergic rhinitis, unspecified: Secondary | ICD-10-CM

## 2015-01-18 DIAGNOSIS — G4733 Obstructive sleep apnea (adult) (pediatric): Secondary | ICD-10-CM

## 2015-01-18 DIAGNOSIS — J302 Other seasonal allergic rhinitis: Secondary | ICD-10-CM

## 2015-01-18 DIAGNOSIS — Z91018 Allergy to other foods: Secondary | ICD-10-CM

## 2015-01-18 DIAGNOSIS — Z91038 Other insect allergy status: Secondary | ICD-10-CM

## 2015-01-18 DIAGNOSIS — J3089 Other allergic rhinitis: Secondary | ICD-10-CM

## 2015-01-18 NOTE — Assessment & Plan Note (Addendum)
Avoidance remains dkey and he has had EpiPen and antihistamines. He expresses strong interest in Hymenoptera venom vaccine which I no longer provide at this office. I have suggested that he switch to an office which can provide that therapy, before getting started in [place] on allergy vaccine so that it all can be managed together.

## 2015-01-18 NOTE — Assessment & Plan Note (Addendum)
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 He will avoid foods that clearly cause problems.

## 2015-01-18 NOTE — Progress Notes (Signed)
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 Carolyne FiscalInman for allergies [facility] [place] [facility], Pt also has Sleep Apnea treated by Dr. Enid SkeenseFeo.  He has moved from [place] to [place] and is trying to transfer some of his care. Asks help getting a primary physician. In particular he wants to transfer his allergy vaccine management. He is satisfied to continue management of his obstructive sleep apnea through [facility] in [place]. Plan-history of recurrent sinus infections and bronchitis as an adult. He doesn't remember being diagnosed with allergic rhinitis or asthma. He describes "huge" improvement in the 2 years that he has been on allergy vaccine with reduced sinus infections. He does use saline rinse. Allergy skin tested twice, most [place] 2 years ago "reactive to almost everything". Recognize triggers described as fall more than spring pollen season. Dry cough persistent each fall, much worse before he began allergy shots. 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 Has EpiPen. No ENT surgery. Remote nasal fracture did not require resetting. Says he was a premature baby and lungs "never good". Ran track in high school trying to compensate for this. One episode of mild pneumonia. GERD: He did use Prilosec but stopped when he began reading adverse reports about PPI's.  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 Carolyne FiscalInman for allergies, transferred [place] [facility], Pt also has Sleep Apnea treated by Dr. Enid SkeenseFeo.  Follows For: Feeling well - Treated for sinus infection by pcp recently. His primary seasonal allergy problem is in the fall. 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 Would like to switch care here. I discussed availability of venom vaccine at other offices. We have not had enough demand for it here to maintain stocks. I have suggested, that before he would get started on allergy vaccine here, he might do better to switch to an office that could offer venom vaccine if that is what he wants to do.  ROS-see HPI Constitutional:   No-   weight loss, night sweats, fevers, chills, fatigue, lassitude. HEENT:   No-  headaches, difficulty swallowing, tooth/dental problems, sore throat,      + sneezing, itching, ear ache, +nasal congestion, post nasal drip,  CV:  No-   chest pain, orthopnea, PND, swelling in lower extremities, anasarca,                                  dizziness, palpitations Resp: No-   shortness of breath with exertion or at rest.              No-   productive cough,  + non-productive cough,  No- coughing up of blood.              No-   change in color of mucus.  No- wheezing.   Skin: No-   rash or lesions. GI:  No-   heartburn, indigestion, abdominal pain, nausea, vomiting,  GU: . MS:  No-   joint pain or swelling.  . Neuro-     nothing unusual Psych:  No- change  in mood or affect. No depression or anxiety.  No memory loss.  OBJ- Physical Exam General- Alert, Oriented, Affect-appropriate, Distress- none acute Skin- rash-none, lesions- none, excoriation- none Lymphadenopathy- none Head- atraumatic            Eyes- Gross vision intact, PERRLA, conjunctivae and secretions clear            Ears- Hearing, canals-normal            Nose- Clear, no-Septal dev, mucus, polyps, erosion, perforation             Throat- Mallampati II , mucosa clear , drainage- none, tonsils- atrophic Neck- flexible , trachea midline, no stridor , thyroid nl, carotid no bruit Chest - symmetrical excursion , unlabored           Heart/CV- RRR , no murmur  , no gallop  , no rub, nl s1 s2                           - JVD- none , edema- none, stasis changes- none, varices- none           Lung- clear to P&A, wheeze- none, cough- none , dullness-none, rub- none           Chest wall-  Abd-  Br/ Gen/ Rectal- Not done, not indicated Extrem- cyanosis- none, clubbing, none, atrophy- none, strength- nl Neuro- grossly intact to observation

## 2015-01-18 NOTE — Patient Instructions (Addendum)
This office no longer does stinging insect venom vaccination. Given the importance of this issue for you, I suggest you switch to one of the other allergy offices in town that does that work- Either [facility] [place], or [facility]- Drs Lucie LeatherKozlow, Geographical information systems [place] and [place] on [address] near [facility].  Either group would be able to take over your allergy vaccine management as appropriate

## 2015-01-18 NOTE — Assessment & Plan Note (Signed)
He wants to continue allergy vaccine from [place]. Because he is interested in Hymenoptera venom vaccine, I suggested that he change to an office that can provide both services if that is what he chooses to do.

## 2015-04-01 ENCOUNTER — Other Ambulatory Visit: Payer: Self-pay | Admitting: Orthopedic Surgery

## 2015-04-01 DIAGNOSIS — S6991XS Unspecified injury of right wrist, hand and finger(s), sequela: Secondary | ICD-10-CM

## 2015-04-20 ENCOUNTER — Other Ambulatory Visit: Payer: BLUE CROSS/BLUE SHIELD

## 2015-05-31 ENCOUNTER — Other Ambulatory Visit (INDEPENDENT_AMBULATORY_CARE_PROVIDER_SITE_OTHER): Payer: Managed Care, Other (non HMO)

## 2015-05-31 ENCOUNTER — Telehealth: Payer: Self-pay | Admitting: Family

## 2015-05-31 ENCOUNTER — Ambulatory Visit (INDEPENDENT_AMBULATORY_CARE_PROVIDER_SITE_OTHER): Payer: Managed Care, Other (non HMO) | Admitting: Family

## 2015-05-31 ENCOUNTER — Encounter: Payer: Self-pay | Admitting: Family

## 2015-05-31 VITALS — BP 120/84 | HR 69 | Temp 98.0°F | Resp 18 | Ht 71.0 in | Wt 207.0 lb

## 2015-05-31 DIAGNOSIS — Z0000 Encounter for general adult medical examination without abnormal findings: Secondary | ICD-10-CM | POA: Diagnosis not present

## 2015-05-31 DIAGNOSIS — J019 Acute sinusitis, unspecified: Secondary | ICD-10-CM | POA: Diagnosis not present

## 2015-05-31 LAB — PSA: PSA: 0.42 ng/mL (ref 0.10–4.00)

## 2015-05-31 MED ORDER — LEVOFLOXACIN 500 MG PO TABS: 500.0000 mg | ORAL_TABLET | Freq: Every day | ORAL | Status: DC

## 2015-05-31 MED ORDER — HYDROCODONE-HOMATROPINE 5-1.5 MG/5ML PO SYRP: 5.0000 mL | ORAL_SOLUTION | Freq: Three times a day (TID) | ORAL | Status: DC | PRN

## 2015-05-31 MED ORDER — CYCLOBENZAPRINE HCL 10 MG PO TABS: 10.0000 mg | ORAL_TABLET | Freq: Three times a day (TID) | ORAL | Status: DC | PRN

## 2015-05-31 NOTE — Telephone Encounter (Signed)
Please inform patient that his PSA is normal.

## 2015-05-31 NOTE — Patient Instructions (Signed)
Thank you for choosing [facility].  Summary/Instructions:  Your prescription(s) have been submitted to your pharmacy or been printed and provided for you. Please take as directed and contact our office if you believe you are having problem(s) with the medication(s) or have any questions.  If your symptoms worsen or fail to improve, please contact our office for further instruction, or in case of emergency go directly to the emergency room at the closest medical facility.   General Recommendations:    Please drink plenty of fluids.  Get plenty of rest   Sleep in humidified air  Use saline nasal sprays  Netti pot   OTC Medications:  Decongestants - helps relieve congestion   Flonase (generic fluticasone) or Nasacort (generic triamcinolone) - please make sure to use the "cross-over" technique at a 45 degree angle towards the opposite eye as opposed to straight up the nasal passageway.   Sudafed (generic pseudoephedrine - Note this is the one that is available behind the pharmacy counter); Products with phenylephrine (-PE) may also be used but is often not as effective as pseudoephedrine.   If you have HIGH BLOOD PRESSURE - Coricidin HBP; AVOID any product that is -D as this contains pseudoephedrine which may increase your blood pressure.  Afrin (oxymetazoline) every 6-8 hours for up to 3 days.   Allergies - helps relieve runny nose, itchy eyes and sneezing   Claritin (generic loratidine), Allegra (fexofenidine), or Zyrtec (generic cyrterizine) for runny nose. These medications should not cause drowsiness.  Note - Benadryl (generic diphenhydramine) may be used however may cause drowsiness  Cough -   Delsym or Robitussin (generic dextromethorphan)  Expectorants - helps loosen mucus to ease removal   Mucinex (generic guaifenesin) as directed on the package.  Headaches / General Aches   Tylenol (generic acetaminophen) - DO NOT EXCEED 3 grams (3,000 mg) in a 24  hour time period  Advil/Motrin (generic ibuprofen)   Sore Throat -   Salt water gargle   Chloraseptic (generic benzocaine) spray or lozenges / Sucrets (generic dyclonine)    Sinusitis Sinusitis is redness, soreness, and inflammation of the paranasal sinuses. Paranasal sinuses are air pockets within the bones of your face (beneath the eyes, the middle of the forehead, or above the eyes). In healthy paranasal sinuses, mucus is able to drain out, and air is able to circulate through them by way of your nose. However, when your paranasal sinuses are inflamed, mucus and air can become trapped. This can allow bacteria and other germs to grow and cause infection. Sinusitis can develop quickly and last only a short time (acute) or continue over a long period (chronic). Sinusitis that lasts for more than 12 weeks is considered chronic.  CAUSES  Causes of sinusitis include:  Allergies.  Structural abnormalities, such as displacement of the cartilage that separates your nostrils (deviated septum), which can decrease the air flow through your nose and sinuses and affect sinus drainage.  Functional abnormalities, such as when the small hairs (cilia) that line your sinuses and help remove mucus do not work properly or are not present. SIGNS AND SYMPTOMS  Symptoms of acute and chronic sinusitis are the same. The primary symptoms are pain and pressure around the affected sinuses. Other symptoms include:  Upper toothache.  Earache.  Headache.  Bad breath.  Decreased sense of smell and taste.  A cough, which worsens when you are lying flat.  Fatigue.  Fever.  Thick drainage from your nose, which often is green and may   contain pus (purulent).  Swelling and warmth over the affected sinuses. DIAGNOSIS  Your health care provider will perform a physical exam. During the exam, your health care provider may:  Look in your nose for signs of abnormal growths in your nostrils (nasal  polyps).  Tap over the affected sinus to check for signs of infection.  View the inside of your sinuses (endoscopy) using an imaging device that has a light attached (endoscope). If your health care provider suspects that you have chronic sinusitis, one or more of the following tests may be recommended:  Allergy tests.  Nasal culture. A sample of mucus is taken from your nose, sent to a lab, and screened for bacteria.  Nasal cytology. A sample of mucus is taken from your nose and examined by your health care provider to determine if your sinusitis is related to an allergy. TREATMENT  Most cases of acute sinusitis are related to a viral infection and will resolve on their own within 10 days. Sometimes medicines are prescribed to help relieve symptoms (pain medicine, decongestants, nasal steroid sprays, or saline sprays).  However, for sinusitis related to a bacterial infection, your health care provider will prescribe antibiotic medicines. These are medicines that will help kill the bacteria causing the infection.  Rarely, sinusitis is caused by a fungal infection. In theses cases, your health care provider will prescribe antifungal medicine. For some cases of chronic sinusitis, surgery is needed. Generally, these are cases in which sinusitis recurs more than 3 times per year, despite other treatments. HOME CARE INSTRUCTIONS   Drink plenty of water. Water helps thin the mucus so your sinuses can drain more easily.  Use a humidifier.  Inhale steam 3 to 4 times a day (for example, sit in the bathroom with the shower running).  Apply a warm, moist washcloth to your face 3 to 4 times a day, or as directed by your health care provider.  Use saline nasal sprays to help moisten and clean your sinuses.  Take medicines only as directed by your health care provider.  If you were prescribed either an antibiotic or antifungal medicine, finish it all even if you start to feel better. SEEK IMMEDIATE  MEDICAL CARE IF:  You have increasing pain or severe headaches.  You have nausea, vomiting, or drowsiness.  You have swelling around your face.  You have vision problems.  You have a stiff neck.  You have difficulty breathing. MAKE SURE YOU:   Understand these instructions.  Will watch your condition.  Will get help right away if you are not doing well or get worse. Document Released: 10/22/2005 Document Revised: 03/08/2014 Document Reviewed: 11/06/2011 Exit[employer] Patient Information 2015 [employer]. This information is not intended to replace advice given to you by your health care provider. Make sure you discuss any questions you have with your health care provider.   

## 2015-05-31 NOTE — Progress Notes (Signed)
Pre visit review using our clinic review tool, if applicable. No additional management support is needed unless otherwise documented below in the visit note. 

## 2015-05-31 NOTE — Progress Notes (Signed)
Subjective:    Patient ID: William Bauer, male    DOB: 08/03/1956, 59 y.o.   MRN: [id number]  Chief Complaint  Patient presents with  . Cough    sore throat that started on thursday, has had head and chest congestion, productive cough,     HPI:  William Bauer is a 59 y.o. male with a PMH of GERD, sleep apnea, and seasonal allergies who presents today for an acute office visit.    This is a new problem. Associated symptoms of sore throat, head congestion, chest congestion, and productive cough going on for 6 days. Modifying factors include flushing sinuses and OTC cough medicine and vitamins. Timing of symptoms are fairly constant throughout the day. Course of the symptoms has improved slightly and then worsened and now plateaued. Denies antibiotic use.    Allergies  Allergen Reactions  . Penicillins     Unknown reaction  . Yellow Jacket Venom [Bee Venom]     Current Outpatient Prescriptions on File Prior to Visit  Medication Sig Dispense Refill  . ALOE VERA JUICE PO Take by mouth. Three times daily    . cholecalciferol (VITAMIN D) 1000 UNITS tablet Take 1,000 Units by mouth daily.    . methylphenidate (RITALIN) 10 MG tablet Take 10 mg by mouth 2 (two) times daily with breakfast and lunch.     . mometasone (NASONEX) 50 MCG/ACT nasal spray Place 2 sprays into the nose daily. 17 g 2  . NON FORMULARY DGL Licorice - 1 tablet three times daily    . Probiotic Product (PROBIOTIC DAILY PO) Take by mouth. Twice a day    . ranitidine (ZANTAC) 150 MG tablet Take 150 mg by mouth 2 (two) times daily.    Marland Kitchen UNABLE TO FIND Slippery [place]m - 2 times daily    . vitamin B-12 (CYANOCOBALAMIN) 100 MCG tablet Take 100 mcg by mouth daily.     No current facility-administered medications on file prior to visit.    Review of Systems  Constitutional: Negative for fever and chills.  HENT: Positive for congestion, sinus pressure and sore throat. Negative for facial swelling.   Respiratory: Positive  for cough. Negative for chest tightness and shortness of breath.   Cardiovascular: Negative for chest pain.  Neurological: Positive for headaches.      Objective:    BP 120/84 mmHg  Pulse 69  Temp(Src) 98 F (36.7 C) (Oral)  Resp 18  Ht  (1.803 m)  Wt 207 lb (93.895 kg)  BMI 28.88 kg/m2  SpO2 96% Nursing note and vital signs reviewed.  Physical Exam  Constitutional: He is oriented to person, place, and time. He appears well-developed and well-nourished. No distress.  HENT:  Right Ear: Hearing, tympanic membrane, external ear and ear canal normal.  Left Ear: Hearing, tympanic membrane, external ear and ear canal normal.  Nose: Right sinus exhibits maxillary sinus tenderness and frontal sinus tenderness. Left sinus exhibits maxillary sinus tenderness and frontal sinus tenderness.  Mouth/Throat: Uvula is midline, oropharynx is clear and moist and mucous membranes are normal.  Cardiovascular: Normal rate, regular rhythm, normal heart sounds and intact distal pulses.   Pulmonary/Chest: Effort normal and breath sounds normal.  Neurological: He is alert and oriented to person, place, and time.  Skin: Skin is warm and dry.  Psychiatric: He has a normal mood and affect. His behavior is normal. Judgment and thought content normal.       Assessment & Plan:   Problem List Items Addressed This  Visit      Respiratory   Sinusitis, acute - Primary    Symptoms consistent with sinusitis. Start levofloxacin. Start hycodan as needed for cough and sleep. Continue over the counter medications as needed for symptom relief and supportive care. Follow up if symptoms worsen or do not improve.       Relevant Medications   levofloxacin (LEVAQUIN) 500 MG tablet   HYDROcodone-homatropine (HYCODAN) 5-1.5 MG/5ML syrup

## 2015-05-31 NOTE — Assessment & Plan Note (Signed)
Symptoms consistent with sinusitis. Start levofloxacin. Start hycodan as needed for cough and sleep. Continue over the counter medications as needed for symptom relief and supportive care. Follow up if symptoms worsen or do not improve.

## 2015-06-02 NOTE — Telephone Encounter (Signed)
Pt aware of results 

## 2015-09-06 ENCOUNTER — Ambulatory Visit (INDEPENDENT_AMBULATORY_CARE_PROVIDER_SITE_OTHER): Payer: Managed Care, Other (non HMO)

## 2015-09-06 DIAGNOSIS — Z23 Encounter for immunization: Secondary | ICD-10-CM | POA: Diagnosis not present

## 2016-03-08 ENCOUNTER — Encounter: Payer: Self-pay | Admitting: Family

## 2016-03-08 ENCOUNTER — Ambulatory Visit (INDEPENDENT_AMBULATORY_CARE_PROVIDER_SITE_OTHER): Payer: Managed Care, Other (non HMO) | Admitting: Family

## 2016-03-08 ENCOUNTER — Other Ambulatory Visit (INDEPENDENT_AMBULATORY_CARE_PROVIDER_SITE_OTHER): Payer: Managed Care, Other (non HMO)

## 2016-03-08 ENCOUNTER — Telehealth: Payer: Self-pay | Admitting: Family

## 2016-03-08 VITALS — BP 142/82 | HR 80 | Temp 98.3°F | Ht 71.0 in | Wt 206.0 lb

## 2016-03-08 DIAGNOSIS — G8929 Other chronic pain: Secondary | ICD-10-CM | POA: Diagnosis not present

## 2016-03-08 DIAGNOSIS — R5383 Other fatigue: Secondary | ICD-10-CM

## 2016-03-08 DIAGNOSIS — F329 Major depressive disorder, single episode, unspecified: Secondary | ICD-10-CM

## 2016-03-08 DIAGNOSIS — M549 Dorsalgia, unspecified: Secondary | ICD-10-CM

## 2016-03-08 DIAGNOSIS — F32A Depression, unspecified: Secondary | ICD-10-CM

## 2016-03-08 LAB — BASIC METABOLIC PANEL
BUN: 17 mg/dL (ref 6–23)
CHLORIDE: 104 meq/L (ref 96–112)
CO2: 29 mEq/L (ref 19–32)
Calcium: 9.5 mg/dL (ref 8.4–10.5)
Creatinine, Ser: 1.09 mg/dL (ref 0.40–1.50)
GFR: 73.87 mL/min (ref >60.00)
GLUCOSE: 98 mg/dL (ref 70–99)
POTASSIUM: 4.3 meq/L (ref 3.5–5.1)
Sodium: 139 mEq/L (ref 135–145)

## 2016-03-08 LAB — CBC WITH DIFFERENTIAL/PLATELET
BASOS ABS: 0 10*3/uL (ref 0.0–0.1)
Basophils Relative: 0.2 % (ref 0.0–3.0)
EOS ABS: 0.1 10*3/uL (ref 0.0–0.7)
Eosinophils Relative: 1.7 % (ref 0.0–5.0)
HCT: 46.4 % (ref 39.0–52.0)
Hemoglobin: 15.7 g/dL (ref 13.0–17.0)
LYMPHS ABS: 1.8 10*3/uL (ref 0.7–4.0)
Lymphocytes Relative: 22.5 % (ref 12.0–46.0)
MCHC: 33.9 g/dL (ref 30.0–36.0)
MCV: 84.5 fl (ref 78.0–100.0)
Monocytes Absolute: 0.6 10*3/uL (ref 0.1–1.0)
Monocytes Relative: 7.5 % (ref 3.0–12.0)
NEUTROS ABS: 5.5 10*3/uL (ref 1.4–7.7)
NEUTROS PCT: 68.1 % (ref 43.0–77.0)
PLATELETS: 282 10*3/uL (ref 150.0–400.0)
RBC: 5.49 Mil/uL (ref 4.22–5.81)
RDW: 13 % (ref 11.5–15.5)
WBC: 8.1 10*3/uL (ref 4.0–10.5)

## 2016-03-08 LAB — TSH: TSH: 1.6 u[IU]/mL (ref 0.35–4.50)

## 2016-03-08 MED ORDER — BUPROPION HCL ER (SR) 150 MG PO TB12: ORAL_TABLET | ORAL | Status: DC

## 2016-03-08 MED ORDER — CYCLOBENZAPRINE HCL 10 MG PO TABS: 10.0000 mg | ORAL_TABLET | Freq: Every evening | ORAL | Status: DC | PRN

## 2016-03-08 NOTE — Telephone Encounter (Signed)
Ok with me 

## 2016-03-08 NOTE — Patient Instructions (Addendum)
Please get lab work on the way down stairs.   Please schedule CPE. Please make follow-up appointment for depression within 4-6 weeks, hopefully we can combine this with CPE.  If there is no improvement in your symptoms, or if there is any worsening of symptoms, or if you have any additional concerns, please return for re-evaluation; or, if we are closed, consider going to the Emergency Room for evaluation if symptoms urgent.  Major Depressive Disorder Major depressive disorder is a mental illness. It also may be called clinical depression or unipolar depression. Major depressive disorder usually causes feelings of sadness, hopelessness, or helplessness. Some people with this disorder do not feel particularly sad but lose interest in doing things they used to enjoy (anhedonia). Major depressive disorder also can cause physical symptoms. It can interfere with work, school, relationships, and other normal everyday activities. The disorder varies in severity but is longer lasting and more serious than the sadness we all feel from time to time in our lives. Major depressive disorder often is triggered by stressful life events or major life changes. Examples of these triggers include divorce, loss of your job or home, a move, and the death of a family member or close friend. Sometimes this disorder occurs for no obvious reason at all. People who have family members with major depressive disorder or bipolar disorder are at higher risk for developing this disorder, with or without life stressors. Major depressive disorder can occur at any age. It may occur just once in your life (single episode major depressive disorder). It may occur multiple times (recurrent major depressive disorder). SYMPTOMS People with major depressive disorder have either anhedonia or depressed mood on nearly a daily basis for at least 2 weeks or longer. Symptoms of depressed mood include:  Feelings of sadness (blue or down in the dumps)  or emptiness.  Feelings of hopelessness or helplessness.  Tearfulness or episodes of crying (may be observed by others).  Irritability (children and adolescents). In addition to depressed mood or anhedonia or both, people with this disorder have at least four of the following symptoms:  Difficulty sleeping or sleeping too much.   Significant change (increase or decrease) in appetite or weight.   Lack of energy or motivation.  Feelings of guilt and worthlessness.   Difficulty concentrating, remembering, or making decisions.  Unusually slow movement (psychomotor retardation) or restlessness (as observed by others).   Recurrent wishes for death, recurrent thoughts of self-harm (suicide), or a suicide attempt. People with major depressive disorder commonly have persistent negative thoughts about themselves, other people, and the world. People with severe major depressive disorder may experiencedistorted beliefs or perceptions about the world (psychotic delusions). They also may see or hear things that are not real (psychotic hallucinations). DIAGNOSIS Major depressive disorder is diagnosed through an assessment by your health care provider. Your health care provider will ask aboutaspects of your daily life, such as mood,sleep, and appetite, to see if you have the diagnostic symptoms of major depressive disorder. Your health care provider may ask about your medical history and use of alcohol or drugs, including prescription medicines. Your health care provider also may do a physical exam and blood work. This is because certain medical conditions and the use of certain substances can cause major depressive disorder-like symptoms (secondary depression). Your health care provider also may refer you to a mental health specialist for further evaluation and treatment. TREATMENT It is important to recognize the symptoms of major depressive disorder and seek treatment. The  following treatments  can be prescribed for this disorder:   Medicine. Antidepressant medicines usually are prescribed. Antidepressant medicines are thought to correct chemical imbalances in the brain that are commonly associated with major depressive disorder. Other types of medicine may be added if the symptoms do not respond to antidepressant medicines alone or if psychotic delusions or hallucinations occur.  Talk therapy. Talk therapy can be helpful in treating major depressive disorder by providing support, education, and guidance. Certain types of talk therapy also can help with negative thinking (cognitive behavioral therapy) and with relationship issues that trigger this disorder (interpersonal therapy). A mental health specialist can help determine which treatment is best for you. Most people with major depressive disorder do well with a combination of medicine and talk therapy. Treatments involving electrical stimulation of the brain can be used in situations with extremely severe symptoms or when medicine and talk therapy do not work over time. These treatments include electroconvulsive therapy, transcranial magnetic stimulation, and vagal nerve stimulation.   This information is not intended to replace advice given to you by your health care provider. Make sure you discuss any questions you have with your health care provider.   Document Released: 02/16/2013 Document Revised: 11/12/2014 Document Reviewed: 02/16/2013 Elsevier Interactive Patient Education [employer]2016 [employer].

## 2016-03-08 NOTE — Progress Notes (Signed)
Subjective:    Patient ID: William Bauer, male    DOB: [birth year], 58 y.o.   MRN: [id number]   William Bauer is a 60 y.o. male who presents today for an acute visit.    HPI Comments: Patient here primarily for fatigue for past two months, worsening Concerned that it is a/w depression. Took Wellbutrin several years ago which help with depression. Describes feeling unmotivated. Hard to fall asleep and stay asleep. Worry about fatigue as now 2 years older than father who died from lung cancer. Family deaths over past couple of years have contributed to stress. Worries about daughter who is back in school.Denies changes in appetite, concentration, weight, anxiety. Patient also denies negative thoughts of hurting oneself or another person.Little exercise, yoga. Minister.  N/o h/o seizure, alcohol abuse.   Also complains of rash LE , chest, and between scrotum between anus which have resolved. Has been using soap with tea tree oil and latromine powder with improvement.   Chronic back pain, stable. Requests refill for Flexeril for occasional use. States he uses the medication approximately 3 times a month for back pain.  Due for endoscopy and plans to call GI. H/o GERD.    Past Medical History  Diagnosis Date  . Sleep apnea   . Allergy   . Sinus trouble   . Arthritis     hands   . Chicken pox   . GERD (gastroesophageal reflux disease)   . Hyperlipidemia   . Colon polyps    Allergies: Penicillins and Yellow jacket venom Current Outpatient Prescriptions on File Prior to Visit  Medication Sig Dispense Refill  . ALOE VERA JUICE PO Take by mouth. Three times daily    . cholecalciferol (VITAMIN D) 1000 UNITS tablet Take 1,000 Units by mouth daily.    . cyclobenzaprine (FLEXERIL) 10 MG tablet Take 1 tablet (10 mg total) by mouth 3 (three) times daily as needed for muscle spasms. 30 tablet 0  . HYDROcodone-homatropine (HYCODAN) 5-1.5 MG/5ML syrup Take 5 mLs by mouth every 8 (eight) hours  as needed for cough. 120 mL 0  . methylphenidate (RITALIN) 10 MG tablet Take 10 mg by mouth 2 (two) times daily with breakfast and lunch.     . mometasone (NASONEX) 50 MCG/ACT nasal spray Place 2 sprays into the nose daily. 17 g 2  . NON FORMULARY DGL Licorice - 1 tablet three times daily    . Probiotic Product (PROBIOTIC DAILY PO) Take by mouth. Twice a day    . ranitidine (ZANTAC) 150 MG tablet Take 150 mg by mouth 2 (two) times daily.    Marland Kitchen UNABLE TO FIND [place] - 2 times daily    . vitamin B-12 (CYANOCOBALAMIN) 100 MCG tablet Take 100 mcg by mouth daily.     No current facility-administered medications on file prior to visit.    Social History  Substance Use Topics  . Smoking status: Never Smoker   . Smokeless tobacco: Never Used  . Alcohol Use: 0.0 oz/week    0 Standard drinks or equivalent per week     Comment: 2 drinks monthly    Review of Systems  Constitutional: Positive for fatigue. Negative for fever, chills, appetite change and unexpected weight change.  HENT: Negative for congestion, ear pain, rhinorrhea, sinus pressure and sore throat.   Respiratory: Negative for cough, shortness of breath and wheezing.   Cardiovascular: Negative for chest pain and palpitations.  Gastrointestinal: Negative for nausea, vomiting and diarrhea.  Musculoskeletal: Negative for myalgias.  Skin: Positive for rash (resolving).  Neurological: Negative for headaches.  Psychiatric/Behavioral: Negative for suicidal ideas.      Objective:    BP 142/82 mmHg  Pulse 80  Temp(Src) 98.3 F (36.8 C) (Oral)  Ht 5\' 11"  (1.803 m)  Wt 206 lb (93.441 kg)  BMI 28.74 kg/m2  SpO2 98%   Physical Exam  Constitutional: He appears well-developed and well-nourished.  Cardiovascular: Regular rhythm and normal heart sounds.   Pulmonary/Chest: Effort normal and breath sounds normal. No respiratory distress. He has no wheezes. He has no rhonchi. He has no rales.  Lymphadenopathy:       Head (left  side): No submandibular and no preauricular adenopathy present.  Neurological: He is alert.  Skin: Skin is warm and dry. No rash noted.  A couple of discrete scabs noted bilateral lower extremities and left side of chest wall.   Psychiatric: He has a normal mood and affect. His speech is normal and behavior is normal.  Vitals reviewed.      Assessment & Plan:   1. Depression  - buPROPion (WELLBUTRIN SR) 150 MG 12 hr tablet; Take one tablet by mouth daily for 3 days, and then increase to one tablet by mouth twice daily.  Dispense: 60 tablet; Refill: 2  2. Fatigue due to depression   3. Chronic back pain Stable, refilled medication.  - cyclobenzaprine (FLEXERIL) 10 MG tablet; Take 1 tablet (10 mg total) by mouth at bedtime as needed for muscle spasms.  Dispense: 30 tablet; Refill:   4. Other fatigue Pending lab work - CBC with Differential/Platelet; Future - TSH; Future - Basic metabolic panel; Future  5. Rash - resolved  I have discontinued Mr. Evrard's levofloxacin. I am also having him maintain his methylphenidate, NON FORMULARY, ALOE VERA JUICE PO, UNABLE TO FIND, Probiotic Product (PROBIOTIC DAILY PO), cholecalciferol, vitamin B-12, ranitidine, mometasone, HYDROcodone-homatropine, cyclobenzaprine, Fluticasone-Salmeterol, and EPINEPHrine.   Meds ordered this encounter  Medications  . Fluticasone-Salmeterol (ADVAIR DISKUS) 100-50 MCG/DOSE AEPB    Sig: Inhale into the lungs.  Marland Kitchen. EPINEPHrine (EPIPEN 2-PAK) 0.3 mg/0.3 mL IJ SOAJ injection    Sig: Inject 0.3 mg into the muscle.     Start medications as prescribed and explained to patient on After Visit Summary ( AVS). Risks, benefits, and alternatives of the medications and treatment plan prescribed today were discussed, and patient expressed understanding.   Education regarding symptom management and diagnosis given to patient.   Follow-up:Plan follow-up as discussed or as needed if any worsening symptoms or change in  condition. No Follow-up on file.   Continue to follow with Jeanine Luzalone, Gregory, FNP for routine health maintenance.   Minette HeadlandAnthony Welden and I agreed with plan.   Rennie PlowmanMargaret Arnett, FNP  Total of 25 minutes spent with patient, greater than 50% of which was spent in discussion of depression.

## 2016-03-08 NOTE — Telephone Encounter (Signed)
[facility]  States Dr. Sherlie BanMcClain referred.  Please advise.

## 2016-03-08 NOTE — Progress Notes (Signed)
Pre visit review using our clinic review tool, if applicable. No additional management support is needed unless otherwise documented below in the visit note. 

## 2016-03-09 NOTE — Telephone Encounter (Signed)
Got scheduled  °

## 2016-03-12 ENCOUNTER — Telehealth: Payer: Self-pay | Admitting: Family

## 2016-03-12 NOTE — Telephone Encounter (Signed)
Patient called back.  I informed him of labs.

## 2016-04-02 ENCOUNTER — Encounter (HOSPITAL_COMMUNITY): Payer: Self-pay | Admitting: Emergency Medicine

## 2016-04-02 ENCOUNTER — Emergency Department (HOSPITAL_COMMUNITY)
Admission: EM | Admit: 2016-04-02 | Discharge: 2016-04-02 | Disposition: A | Discharge status: Home / Self Care | Payer: Managed Care, Other (non HMO) | Attending: Emergency Medicine | Admitting: Emergency Medicine

## 2016-04-02 ENCOUNTER — Emergency Department (HOSPITAL_COMMUNITY): Payer: Managed Care, Other (non HMO)

## 2016-04-02 DIAGNOSIS — S60312A Abrasion of left thumb, initial encounter: Secondary | ICD-10-CM | POA: Diagnosis not present

## 2016-04-02 DIAGNOSIS — Y929 Unspecified place or not applicable: Secondary | ICD-10-CM | POA: Diagnosis not present

## 2016-04-02 DIAGNOSIS — Y999 Unspecified external cause status: Secondary | ICD-10-CM | POA: Insufficient documentation

## 2016-04-02 DIAGNOSIS — Z79899 Other long term (current) drug therapy: Secondary | ICD-10-CM | POA: Diagnosis not present

## 2016-04-02 DIAGNOSIS — Y939 Activity, unspecified: Secondary | ICD-10-CM | POA: Insufficient documentation

## 2016-04-02 DIAGNOSIS — W311XXA Contact with metalworking machines, initial encounter: Secondary | ICD-10-CM | POA: Insufficient documentation

## 2016-04-02 MED ORDER — LIDOCAINE HCL (PF) 1 % IJ SOLN
30.0000 mL | Freq: Once | INTRAMUSCULAR | Status: AC
  Administered 2016-04-02: 30 mL via INTRADERMAL
  Filled 2016-04-02: qty 30

## 2016-04-02 NOTE — ED Notes (Signed)
Patient c/o left thumb injury s/t drill bit through thumb. Deep injury to left thumb, deep vasculature visible, underlying fascia visible. Distal thumb sensation intact.

## 2016-04-02 NOTE — ED Notes (Signed)
MD and PA at bedside.  

## 2016-04-02 NOTE — ED Notes (Signed)
Patient was alert, oriented and stable upon discharge. RN went over AVS and patient had no further questions.  

## 2016-04-02 NOTE — Discharge Instructions (Signed)
Keep your appointment you have with your primary care provider on June 1 to have your wound rechecked.  Return to the emergency department if you experience worsening pain, redness surrounding the wound, increased warmth surrounding the wound, purulent discharge, fever, chills, any signs of infection.  Wound Care Taking care of your wound properly can help to prevent pain and infection. It can also help your wound to heal more quickly.  HOW TO CARE FOR YOUR WOUND  Take or apply over-the-counter and prescription medicines only as told by your health care provider.  If you were prescribed antibiotic medicine, take or apply it as told by your health care provider. Do not stop using the antibiotic even if your condition improves.  Clean the wound each day or as told by your health care provider.  Wash the wound with mild soap and water.  Rinse the wound with water to remove all soap.  Pat the wound dry with a clean towel. Do not rub it.  There are many different ways to close and cover a wound. For example, a wound can be covered with stitches (sutures), skin glue, or adhesive strips. Follow instructions from your health care provider about:  How to take care of your wound.  When and how you should change your bandage (dressing).  When you should remove your dressing.  Removing whatever was used to close your wound.  Check your wound every day for signs of infection. Watch for:  Redness, swelling, or pain.  Fluid, blood, or pus.  Keep the dressing dry until your health care provider says it can be removed. Do not take baths, swim, use a hot tub, or do anything that would put your wound underwater until your health care provider approves.  Raise (elevate) the injured area above the level of your heart while you are sitting or lying down.  Do not scratch or pick at the wound.  Keep all follow-up visits as told by your health care provider. This is important. SEEK MEDICAL CARE  IF:  You received a tetanus shot and you have swelling, severe pain, redness, or bleeding at the injection site.  You have a fever.  Your pain is not controlled with medicine.  You have increased redness, swelling, or pain at the site of your wound.  You have fluid, blood, or pus coming from your wound.  You notice a bad smell coming from your wound or your dressing. SEEK IMMEDIATE MEDICAL CARE IF:  You have a red streak going away from your wound.   This information is not intended to replace advice given to you by your health care provider. Make sure you discuss any questions you have with your health care provider.   Document Released: 07/31/2008 Document Revised: 03/08/2015 Document Reviewed: 10/18/2014 Elsevier Interactive Patient Education [employer]2016 [employer]vier Inc.

## 2016-04-02 NOTE — ED Provider Notes (Signed)
CSN: [id number]     Arrival date & time 04/02/16  1158 History   First MD Initiated Contact with Patient 04/02/16 1351     Chief Complaint  Patient presents with  . Hand Injury     (Consider location/radiation/quality/duration/timing/severity/associated sxs/prior Treatment) HPI   Patient is a 60 year old male with a history of arthritis and GERD who presents the ED with an abrasion to his left thumb. Patient states when he was using a drill today he got distracted and the drill touched the pad of his left thumb which took off a significant amount of skin. Pt states throbbing, constant, non-radiating pain. He has not taken anything for the pain. Nothing makes it better. He had a difficult time controlling the bleeding. He denies numbness, weakness of his thumb hand or wrist.  Past Medical History  Diagnosis Date  . Sleep apnea   . Allergy   . Sinus trouble   . Arthritis     hands   . Chicken pox   . GERD (gastroesophageal reflux disease)   . Hyperlipidemia   . Colon polyps    Past Surgical History  Procedure Laterality Date  . Cartlage removed      Right knee   Family History  Problem Relation Age of Onset  . Diabetes Maternal Aunt   . Breast cancer Maternal Aunt   . Lung cancer Father   . Hyperlipidemia Father   . Peripheral Artery Disease Mother   . Macular degeneration Mother   . Arthritis Mother   . Hyperlipidemia Mother   . Stroke Mother   . Hypertension Mother   . Heart disease Maternal Grandfather   . Hyperlipidemia Maternal Grandfather   . Heart disease Paternal Grandfather   . Hyperlipidemia Paternal Grandfather   . Alcohol abuse Maternal Uncle    Social History  Substance Use Topics  . Smoking status: Never Smoker   . Smokeless tobacco: Never Used  . Alcohol Use: 0.0 oz/week    0 Standard drinks or equivalent per week     Comment: 2 drinks monthly    Review of Systems  Gastrointestinal: Negative for nausea and vomiting.  Skin: Positive for wound.   Neurological: Negative for dizziness, syncope, weakness and numbness.  Psychiatric/Behavioral: Negative for confusion.      Allergies  Penicillins and Yellow jacket venom  Home Medications   Prior to Admission medications   Medication Sig Start Date End Date Taking? Authorizing Provider  ALOE VERA JUICE PO Take 1 tablet by mouth 3 (three) times daily.    Yes Historical Provider, MD  buPROPion (WELLBUTRIN SR) 150 MG 12 hr tablet Take one tablet by mouth daily for 3 days, and then increase to one tablet by mouth twice daily. Patient taking differently: Take 150 mg by mouth 2 (two) times daily.  03/08/16  Yes Allegra Grana, FNP  cholecalciferol (VITAMIN D) 1000 UNITS tablet Take 1,000 Units by mouth daily.   Yes Historical Provider, MD  cyclobenzaprine (FLEXERIL) 10 MG tablet Take 1 tablet (10 mg total) by mouth at bedtime as needed for muscle spasms. 03/08/16  Yes Allegra Grana, FNP  EPINEPHrine (EPIPEN 2-PAK) 0.3 mg/0.3 mL IJ SOAJ injection Inject 0.3 mg into the muscle. 07/17/13  Yes Historical Provider, MD  Ginger, Zingiber officinalis, (GINGER PO) Take 2 tablets by mouth daily.   Yes Historical Provider, MD  methylphenidate 27 MG PO TB24 Take 27 mg by mouth daily.   Yes Historical Provider, MD  mometasone (NASONEX) 50 MCG/ACT nasal spray  Place 2 sprays into the nose daily. Patient taking differently: Place 2 sprays into the nose daily as needed (allergies.).  12/30/14  Yes Veryl SpeakGregory D Calone, FNP  NON FORMULARY DGL Licorice - 1 tablet three times daily   Yes Historical Provider, MD  ranitidine (ZANTAC) 150 MG tablet Take 150 mg by mouth daily as needed for heartburn.    Yes Historical Provider, MD  UNABLE TO FIND Slippery Elm - 2 times daily   Yes Historical Provider, MD  HYDROcodone-homatropine (HYCODAN) 5-1.5 MG/5ML syrup Take 5 mLs by mouth every 8 (eight) hours as needed for cough. Patient not taking: Reported on 04/02/2016 05/31/15   Veryl SpeakGregory D Calone, FNP   BP 134/109 mmHg  Pulse  86  Temp(Src) 98.3 F (36.8 C) (Oral)  Resp 20  SpO2 95% Physical Exam  Constitutional: He appears well-developed and well-nourished. No distress.  HENT:  Head: Normocephalic and atraumatic.  Eyes: Conjunctivae are normal.  Neck: Normal range of motion.  Pulmonary/Chest: Effort normal.  Musculoskeletal: Normal range of motion.  Normal AROM of bilateral hands and fingers. Pt is neurovascular intact distally  Neurological: He is alert. Coordination normal.  Skin: Skin is warm and dry.  2x3 cm deep abrasion noted to pad of left thumb. No underlying structures noted, bleeding controlled.  Psychiatric: He has a normal mood and affect. His behavior is normal.    ED Course  .Nerve Block Date/Time: 04/02/2016 3:09 PM Performed by: Rhona RaiderFOCHT, Denajah Farias L Authorized by: Mattie MarlinFOCHT, Jasmain Ahlberg L Consent: Verbal consent obtained. Risks and benefits: risks, benefits and alternatives were discussed Consent given by: patient Patient understanding: patient states understanding of the procedure being performed Site marked: the operative site was marked Imaging studies: imaging studies available Required items: required blood products, implants, devices, and special equipment available Patient identity confirmed: verbally with patient and arm band Time out: Immediately prior to procedure a "time out" was called to verify the correct patient, procedure, equipment, support staff and site/side marked as required. Indications: pain relief and debridement Body area: upper extremity Nerve: digital Laterality: left Patient sedated: no Preparation: Patient was prepped and draped in the usual sterile fashion. Patient position: sitting Needle gauge: 25 G Location technique: anatomical landmarks Local anesthetic: lidocaine 1% without epinephrine Anesthetic total: 3 ml Outcome: pain improved Patient tolerance: Patient tolerated the procedure well with no immediate complications   (including critical care  time) Labs Review Labs Reviewed - No data to display  Imaging Review Dg Finger Thumb Left  04/02/2016  CLINICAL DATA:  Left thumb drill bit injury EXAM: LEFT THUMB 2+V COMPARISON:  None. FINDINGS: Overlying bandage obscures fine bone detail. No fracture, dislocation or suspicious focal osseous lesion. No radiopaque foreign body. Moderate first carpometacarpal joint osteoarthritis. IMPRESSION: No fracture or dislocation.  No radiopaque foreign body. Electronically Signed   By: Delbert PhenixJason A Poff M.D.   On: 04/02/2016 12:35   I have personally reviewed and evaluated these images and lab results as part of my medical decision-making.   EKG Interpretation None      MDM   Final diagnoses:  Abrasion of thumb, left, initial encounter   Tdap booster not given, pt states he has had one within the last 2 years. Abrasion not suitable for sutures. Digital block performed and wound cleaned and no foreign objects noted. Xray revealed no bony abnormalities. Pt has no co morbidities to effect normal wound healing. Wound dressed and discussed home care w pt and answered questions. Pt to f-u for wound check at his PCP  at his scheduled appointment this Thursday June 1st. BP elevated at time of arrival likely 2/2 pain. Pt is hemodynamically stable w no complaints prior to dc.       Jerre Simon, PA 04/02/16 1515  Melene Plan, DO 04/02/16 1517

## 2016-04-02 NOTE — ED Notes (Signed)
PA at bedside.

## 2016-04-05 ENCOUNTER — Ambulatory Visit (INDEPENDENT_AMBULATORY_CARE_PROVIDER_SITE_OTHER): Payer: Managed Care, Other (non HMO) | Admitting: Internal Medicine

## 2016-04-05 ENCOUNTER — Encounter: Payer: Self-pay | Admitting: Internal Medicine

## 2016-04-05 VITALS — BP 118/80 | HR 98 | Temp 98.7°F | Resp 16 | Ht 71.0 in | Wt 205.0 lb

## 2016-04-05 DIAGNOSIS — K2100 Gastro-esophageal reflux disease with esophagitis, without bleeding: Secondary | ICD-10-CM

## 2016-04-05 DIAGNOSIS — B3749 Other urogenital candidiasis: Secondary | ICD-10-CM | POA: Diagnosis not present

## 2016-04-05 DIAGNOSIS — R1310 Dysphagia, unspecified: Secondary | ICD-10-CM

## 2016-04-05 DIAGNOSIS — Z0000 Encounter for general adult medical examination without abnormal findings: Secondary | ICD-10-CM

## 2016-04-05 DIAGNOSIS — Z1159 Encounter for screening for other viral diseases: Secondary | ICD-10-CM

## 2016-04-05 MED ORDER — FLUCONAZOLE 200 MG PO TABS: 200.0000 mg | ORAL_TABLET | Freq: Every day | ORAL | Status: AC

## 2016-04-05 NOTE — Progress Notes (Signed)
Pre visit review using our clinic review tool, if applicable. No additional management support is needed unless otherwise documented below in the visit note. 

## 2016-04-05 NOTE — Patient Instructions (Signed)

## 2016-04-05 NOTE — Progress Notes (Signed)
Subjective:  Patient ID: William Bauer, male    DOB: 1956/10/12  Age: 60 y.o. MRN: [id number]  CC: Gastroesophageal Reflux; Rash; and Annual Exam  NEW TO ME  HPI William Bauer presents for a CPX.  He complains of an itchy rash around his anus. It is been present for several months. He tells me that the area feels moist and sometimes it oozes. He denies diarrhea, lymphadenopathy, fever, chills.  He is also concerned about heartburn. He has had a few episodes of odynophagia and tells me that any he had an EGD done about 10 years ago that was unremarkable. He was previously treated with Prilosec but then he became concerned about some of the possible complications from PPI therapy so was recently changed to ranitidine. He takes that periodically and it does help some with the heartburn. He is also using herbal product such as aloe vera juice and Ginger and says those help quite a bit as well.  History William Bauer has a past medical history of Sleep apnea; Allergy; Sinus trouble; Arthritis; Chicken pox; GERD (gastroesophageal reflux disease); Hyperlipidemia; and Colon polyps.   He has past surgical history that includes Cartlage removed.   His family history includes Alcohol abuse in his maternal uncle; Arthritis in his mother; Breast cancer in his maternal aunt; Diabetes in his maternal aunt; Heart disease in his maternal grandfather and paternal grandfather; Hyperlipidemia in his father, maternal grandfather, mother, and paternal grandfather; Hypertension in his mother; Lung cancer in his father; Macular degeneration in his mother; Peripheral Artery Disease in his mother; Stroke in his mother.He reports that he has never smoked. He has never used smokeless tobacco. He reports that he drinks alcohol. He reports that he does not use illicit drugs.  Outpatient Prescriptions Prior to Visit  Medication Sig Dispense Refill  . ALOE VERA JUICE PO Take 1 tablet by mouth 3 (three) times daily.     Marland Kitchen.  buPROPion (WELLBUTRIN SR) 150 MG 12 hr tablet Take one tablet by mouth daily for 3 days, and then increase to one tablet by mouth twice daily. (Patient taking differently: Take 150 mg by mouth 2 (two) times daily. ) 60 tablet 2  . cholecalciferol (VITAMIN D) 1000 UNITS tablet Take 1,000 Units by mouth daily.    . cyclobenzaprine (FLEXERIL) 10 MG tablet Take 1 tablet (10 mg total) by mouth at bedtime as needed for muscle spasms. 30 tablet 0  . EPINEPHrine (EPIPEN 2-PAK) 0.3 mg/0.3 mL IJ SOAJ injection Inject 0.3 mg into the muscle.    . Ginger, Zingiber officinalis, (GINGER PO) Take 2 tablets by mouth daily.    . methylphenidate 27 MG PO TB24 Take 27 mg by mouth daily.    . mometasone (NASONEX) 50 MCG/ACT nasal spray Place 2 sprays into the nose daily. (Patient taking differently: Place 2 sprays into the nose daily as needed (allergies.). ) 17 g 2  . NON FORMULARY DGL Licorice - 1 tablet three times daily    . ranitidine (ZANTAC) 150 MG tablet Take 150 mg by mouth daily as needed for heartburn.     Marland Kitchen. UNABLE TO FIND Slippery Elm - 2 times daily    . HYDROcodone-homatropine (HYCODAN) 5-1.5 MG/5ML syrup Take 5 mLs by mouth every 8 (eight) hours as needed for cough. (Patient not taking: Reported on 04/02/2016) 120 mL 0   No facility-administered medications prior to visit.    ROS Review of Systems  Constitutional: Negative.  Negative for chills, diaphoresis, activity change, fatigue and unexpected  weight change.  HENT: Positive for trouble swallowing. Negative for congestion, facial swelling, sinus pressure, sore throat and voice change.   Eyes: Negative.  Negative for visual disturbance.  Respiratory: Negative.  Negative for cough, choking, chest tightness, shortness of breath and stridor.   Cardiovascular: Negative.  Negative for chest pain, palpitations and leg swelling.  Gastrointestinal: Negative.  Negative for nausea, abdominal pain, diarrhea, constipation and blood in stool.  Endocrine:  Negative.   Genitourinary: Negative.   Musculoskeletal: Negative.  Negative for myalgias, back pain, joint swelling, arthralgias and neck pain.  Skin: Positive for rash. Negative for color change, pallor and wound.  Allergic/Immunologic: Negative.   Neurological: Negative.   Hematological: Negative.  Negative for adenopathy. Does not bruise/bleed easily.  Psychiatric/Behavioral: Negative.     Objective:  BP 118/80 mmHg  Pulse 98  Temp(Src) 98.7 F (37.1 C) (Oral)  Resp 16  Ht 5\' 11"  (1.803 m)  Wt 205 lb (92.987 kg)  BMI 28.60 kg/m2  SpO2 93%  Physical Exam  Constitutional: He is oriented to person, place, and time. No distress.  HENT:  Head: Normocephalic and atraumatic.  Mouth/Throat: Oropharynx is clear and moist. No oropharyngeal exudate.  Eyes: Conjunctivae are normal. Right eye exhibits no discharge. Left eye exhibits no discharge. No scleral icterus.  Neck: Normal range of motion. Neck supple. No JVD present. No tracheal deviation present. No thyromegaly present.  Cardiovascular: Normal rate, regular rhythm and intact distal pulses.  Exam reveals no gallop and no friction rub.   No murmur heard. Pulses:      Carotid pulses are 1+ on the right side, and 1+ on the left side.      Radial pulses are 1+ on the right side, and 1+ on the left side.       Femoral pulses are 1+ on the right side, and 1+ on the left side.      Popliteal pulses are 1+ on the right side, and 1+ on the left side.       Dorsalis pedis pulses are 1+ on the right side, and 1+ on the left side.       Posterior tibial pulses are 1+ on the right side, and 1+ on the left side.  EKG -----  Sinus  Rhythm  WITHIN NORMAL LIMITS   Pulmonary/Chest: Effort normal and breath sounds normal. No stridor. No respiratory distress. He has no wheezes. He has no rales. He exhibits no tenderness.  Abdominal: Soft. Bowel sounds are normal. He exhibits no distension and no mass. There is no tenderness. There is no rebound  and no guarding. Hernia confirmed negative in the right inguinal area and confirmed negative in the left inguinal area.  Genitourinary: Rectum normal, testes normal and penis normal. Rectal exam shows no external hemorrhoid, no internal hemorrhoid, no fissure, no mass, no tenderness and anal tone normal. Guaiac negative stool. Prostate is enlarged (1+ smooth symm BPH). Prostate is not tender. Right testis shows no mass, no swelling and no tenderness. Right testis is descended. Left testis shows no mass, no swelling and no tenderness. Left testis is descended. Circumcised. No penile erythema or penile tenderness. No discharge found.     Musculoskeletal: Normal range of motion. He exhibits no edema or tenderness.  Lymphadenopathy:    He has no cervical adenopathy.       Right: No inguinal adenopathy present.       Left: No inguinal adenopathy present.  Neurological: He is oriented to person, place, and time.  Skin: Skin is warm and dry. No rash noted. He is not diaphoretic. No erythema. No pallor.  Psychiatric: He has a normal mood and affect. His behavior is normal. Judgment and thought content normal.  Vitals reviewed.    Lab Results  Component Value Date   WBC 8.1 03/08/2016   HGB 15.7 03/08/2016   HCT 46.4 03/08/2016   PLT 282.0 03/08/2016   GLUCOSE 98 03/08/2016   CHOL 193 12/30/2014   TRIG 179.0* 12/30/2014   HDL 45.30 12/30/2014   LDLCALC 112* 12/30/2014   NA 139 03/08/2016   K 4.3 03/08/2016   CL 104 03/08/2016   CREATININE 1.09 03/08/2016   BUN 17 03/08/2016   CO2 29 03/08/2016   TSH 1.60 03/08/2016   PSA 0.42 05/31/2015   Assessment & Plan:   Maysen was seen today for gastroesophageal reflux, rash and annual exam.  Diagnoses and all orders for this visit:  Candida infection of genital region -     fluconazole (DIFLUCAN) 200 MG tablet; Take 1 tablet (200 mg total) by mouth daily.  Gastroesophageal reflux disease with esophagitis- he will continue using ranitidine  as needed and will also continue allopurinol and ginger as needed for symptom relief -     Ambulatory referral to Gastroenterology  Preventative health care- exam completed, labs ordered and reviewed, his colonoscopy is up-to-date, vaccines were reviewed and updated if indicated, he was given patient education material. -     EKG 12-Lead -     Lipid panel; Future -     Comprehensive metabolic panel; Future -     CBC with Differential/Platelet; Future -     PSA; Future -     TSH; Future -     HIV antibody; Future  Dysphagia- I have asked him to see gastroenterology to see if an upper endoscopy is indicated to screen for Barrett's, esophageal stricture, esophageal ulcer, or esophagitis. -     Ambulatory referral to Gastroenterology  Need for hepatitis C screening test -     Hepatitis C antibody; Future   I have discontinued Mr. Boakye's HYDROcodone-homatropine. I am also having him start on fluconazole. Additionally, I am having him maintain his NON FORMULARY, ALOE VERA JUICE PO, UNABLE TO FIND, cholecalciferol, ranitidine, mometasone, EPINEPHrine, buPROPion, cyclobenzaprine, methylphenidate, and (Ginger, Zingiber officinalis, (GINGER PO)).  Meds ordered this encounter  Medications  . fluconazole (DIFLUCAN) 200 MG tablet    Sig: Take 1 tablet (200 mg total) by mouth daily.    Dispense:  10 tablet    Refill:  0     Follow-up: Return in about 4 weeks (around 05/03/2016).  Sanda Linger, MD

## 2016-04-11 ENCOUNTER — Encounter: Payer: Self-pay | Admitting: Internal Medicine

## 2016-04-11 ENCOUNTER — Telehealth: Payer: Self-pay | Admitting: Internal Medicine

## 2016-04-11 ENCOUNTER — Ambulatory Visit (INDEPENDENT_AMBULATORY_CARE_PROVIDER_SITE_OTHER): Payer: Managed Care, Other (non HMO) | Admitting: Internal Medicine

## 2016-04-11 VITALS — BP 116/86 | HR 92 | Temp 98.3°F | Resp 16 | Wt 206.0 lb

## 2016-04-11 DIAGNOSIS — S60312D Abrasion of left thumb, subsequent encounter: Secondary | ICD-10-CM

## 2016-04-11 DIAGNOSIS — S60319A Abrasion of unspecified thumb, initial encounter: Secondary | ICD-10-CM | POA: Insufficient documentation

## 2016-04-11 DIAGNOSIS — J019 Acute sinusitis, unspecified: Secondary | ICD-10-CM

## 2016-04-11 NOTE — Patient Instructions (Signed)
Monitor your sinus infection and if it gets worse let me know.  Your thumb does not look infected at this point - please continue what you are doing.  If you feel it is becoming infected - increased redness, pain and pus - please call.

## 2016-04-11 NOTE — Progress Notes (Signed)
Pre visit review using our clinic review tool, if applicable. No additional management support is needed unless otherwise documented below in the visit note. 

## 2016-04-11 NOTE — Assessment & Plan Note (Signed)
Symptoms suggestive of viral URI or sinus infection No need for an antibiotic continue otc meds Call if symptoms change

## 2016-04-11 NOTE — Assessment & Plan Note (Signed)
Injury with drill 5/29, seen in ED Td up to date Healing, minimal symptoms, ? Pus  Thumb looks good without evidence of infection. Discussed what to monitor for Call if symptoms change Continue topical anti-bacterial and keep it covered No need for an oral antibotic

## 2016-04-11 NOTE — Telephone Encounter (Signed)
[facility] - Client TELEPHONE ADVICE RECORD [facility]  Patient Name: William HeadlandNTHONY William  DOB: July 22, 1956    Initial Comment Caller states injured left thumb last Monday; quarter sized spot; tissue removed; went to ED; last night let it stay open so it would dry; had 3 small pus spots. cleaned it again and gone back to moist regimen and keeping it covered. has photos;    Nurse Assessment  Nurse: Dorthula RuePatten, RN, Enrique SackKendra Date/Time (Eastern Time): 04/11/2016 11:18:36 AM  Confirm and document reason for call. If symptomatic, describe symptoms. You must click the next button to save text entered. ---Caller states he injured his thumb on [place]. He states it was deep abrasion wound, he states it not to the bone but very deep. He states the ER told him to use either moist or dry dressing. He states he let the wound dry out last night. He states he has some pus bubbles on the wound. He states he moved back to moist dressing again, that included Epsom salts and bacitracin.  Has the patient traveled out of the country within the last 30 days? ---Not Applicable  Does the patient have any new or worsening symptoms? ---Yes  Will a triage be completed? ---Yes  Related visit to physician within the last 2 weeks? ---Yes  Does the PT have any chronic conditions? (i.e. diabetes, asthma, etc.) ---Yes  List chronic conditions. ---Allergies, Depression  Is this a behavioral health or substance abuse call? ---No     Guidelines    Guideline Title Affirmed Question Affirmed Notes  Wound Infection [1] Pus or cloudy fluid draining from wound AND [2] no fever    Final Disposition User   See Physician within 24 Hours ThornportPatten, RN, Enrique SackKendra    Comments  Scheduled with Cheryll CockayneStacy Burns at 4pm today   Referrals  REFERRED TO PCP OFFICE   Disagree/Comply: Comply

## 2016-04-11 NOTE — Progress Notes (Signed)
Subjective:    Patient ID: William Bauer, male    DOB: Aug 05, 1956, 60 y.o.   MRN: [id number]  HPI He is here for an acute visit for a thumb injury.   He hit his left thurmb with a drill may 29th. He did go to the ED.  It has been healing  - the wound is the size of a quarter.  There was some pus last night that had not been there before and he was worried about an infection.  No redness/ nubmnes/tingling.  Pain is minimal.  No fever.  He has been applying bacitracin and keeping it covered.   He thinks he also has some sinus infection and he is on a cream for a yeast infection on his buttock.    His cold symptoms include as had some hoarseness, mild headaches, cough on occasion.  He has mild nasal congestion.  He started using a nasal spray.     Medications and allergies reviewed with patient and updated if appropriate.  Patient Active Problem List   Diagnosis Date Noted  . Candida infection of genital region 04/05/2016  . Dysphagia 04/05/2016  . Need for hepatitis C screening test 04/05/2016  . Preventative health care 04/05/2016  . Routine general medical examination at a health care facility 01/07/2015  . Sinusitis, acute 12/30/2014  . Hyperlipidemia 12/24/2014  . Seasonal and perennial allergic rhinitis 11/23/2014  . Food allergy 11/23/2014  . 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 GERD (gastroesophageal reflux disease) 11/23/2014  . Obstructive sleep apnea 11/23/2014    Current Outpatient Prescriptions on File Prior to Visit  Medication Sig Dispense Refill  . ALOE VERA JUICE PO Take 1 tablet by mouth 3 (three) times daily.     Marland Kitchen. buPROPion (WELLBUTRIN SR) 150 MG 12 hr tablet Take one tablet by mouth daily for 3 days, and then increase to one tablet by mouth twice daily. (Patient taking differently: Take 150 mg by mouth 2 (two) times daily. ) 60 tablet 2  . cholecalciferol (VITAMIN D) 1000 UNITS tablet Take 1,000 Units by mouth daily.    . cyclobenzaprine (FLEXERIL) 10  MG tablet Take 1 tablet (10 mg total) by mouth at bedtime as needed for muscle spasms. 30 tablet 0  . EPINEPHrine (EPIPEN 2-PAK) 0.3 mg/0.3 mL IJ SOAJ injection Inject 0.3 mg into the muscle.    . fluconazole (DIFLUCAN) 200 MG tablet Take 1 tablet (200 mg total) by mouth daily. 10 tablet 0  . Ginger, Zingiber officinalis, (GINGER PO) Take 2 tablets by mouth daily.    . methylphenidate 27 MG PO TB24 Take 27 mg by mouth daily.    . mometasone (NASONEX) 50 MCG/ACT nasal spray Place 2 sprays into the nose daily. (Patient taking differently: Place 2 sprays into the nose daily as needed (allergies.). ) 17 g 2  . NON FORMULARY DGL Licorice - 1 tablet three times daily    . ranitidine (ZANTAC) 150 MG tablet Take 150 mg by mouth daily as needed for heartburn.     Marland Kitchen. UNABLE TO FIND Slippery Elm - 2 times daily     No current facility-administered medications on file prior to visit.    Past Medical History  Diagnosis Date  . Sleep apnea   . Allergy   . Sinus trouble   . Arthritis     hands   . Chicken pox   . GERD (gastroesophageal reflux disease)   . Hyperlipidemia   . Colon polyps  Past Surgical History  Procedure Laterality Date  . Cartlage removed      Right knee    Social History   Social History  . Marital Status: Married    Spouse Name: N/A  . Number of Children: 1  . Years of Education: 18   Occupational History  . Minister    Social History Main Topics  . Smoking status: Never Smoker   . Smokeless tobacco: Never Used  . Alcohol Use: 0.0 oz/week    0 Standard drinks or equivalent per week     Comment: 2 drinks monthly  . Drug Use: No  . Sexual Activity: Not on file   Other Topics Concern  . Not on file   Social History Narrative   Born and raised in [place], [place]. Currently resides in a house. 3 dogs. Fun: Yardwork, golf, read   Denies any religious beliefs effecting health care.     Family History  Problem Relation Age of Onset  . Diabetes Maternal  Aunt   . Breast cancer Maternal Aunt   . Lung cancer Father   . Hyperlipidemia Father   . Peripheral Artery Disease Mother   . Macular degeneration Mother   . Arthritis Mother   . Hyperlipidemia Mother   . Stroke Mother   . Hypertension Mother   . Heart disease Maternal Grandfather   . Hyperlipidemia Maternal Grandfather   . Heart disease Paternal Grandfather   . Hyperlipidemia Paternal Grandfather   . Alcohol abuse Maternal Uncle     Review of Systems  Constitutional: Negative for fever.  HENT: Positive for congestion (mild) and postnasal drip. Negative for sinus pressure and sore throat.   Respiratory: Negative for shortness of breath and wheezing. Cough: occasional, dry.   Neurological: Negative for weakness and numbness.       Objective:   Filed Vitals:   04/11/16 1616  BP: 116/86  Pulse: 92  Temp: 98.3 F (36.8 C)  Resp: 16   Filed Weights   04/11/16 1616  Weight: 206 lb (93.441 kg)   Body mass index is 28.74 kg/(m^2).   Physical Exam  Constitutional: He appears well-developed and well-nourished. No distress.  Cardiovascular:  Good radial pulse LUE  Skin: He is not diaphoretic.  Left thumb with skin abrasion with missing skin between tip and DIP, healthy granulation tissue, no pus or active discharge, no fluctuance, no surrounding erythema or tenderness, no swelling, normal sensation tip of thumb          Assessment & Plan:   See Problem List for Assessment and Plan of chronic medical problems.

## 2016-04-25 ENCOUNTER — Ambulatory Visit (INDEPENDENT_AMBULATORY_CARE_PROVIDER_SITE_OTHER): Payer: Managed Care, Other (non HMO) | Admitting: Internal Medicine

## 2016-04-25 ENCOUNTER — Encounter: Payer: Self-pay | Admitting: Internal Medicine

## 2016-04-25 VITALS — BP 122/82 | HR 83 | Temp 98.9°F | Resp 16 | Ht 71.0 in | Wt 205.0 lb

## 2016-04-25 DIAGNOSIS — J302 Other seasonal allergic rhinitis: Secondary | ICD-10-CM

## 2016-04-25 DIAGNOSIS — J4521 Mild intermittent asthma with (acute) exacerbation: Secondary | ICD-10-CM

## 2016-04-25 DIAGNOSIS — J0100 Acute maxillary sinusitis, unspecified: Secondary | ICD-10-CM

## 2016-04-25 DIAGNOSIS — J309 Allergic rhinitis, unspecified: Secondary | ICD-10-CM | POA: Diagnosis not present

## 2016-04-25 DIAGNOSIS — J45901 Unspecified asthma with (acute) exacerbation: Secondary | ICD-10-CM | POA: Insufficient documentation

## 2016-04-25 DIAGNOSIS — J3089 Other allergic rhinitis: Principal | ICD-10-CM

## 2016-04-25 MED ORDER — LEVOFLOXACIN 750 MG PO TABS: 750.0000 mg | ORAL_TABLET | Freq: Every day | ORAL | Status: AC

## 2016-04-25 MED ORDER — METHYLPREDNISOLONE ACETATE 80 MG/ML IJ SUSP
120.0000 mg | Freq: Once | INTRAMUSCULAR | Status: AC
  Administered 2016-04-25: 120 mg via INTRAMUSCULAR

## 2016-04-25 NOTE — Progress Notes (Signed)
Subjective:  Patient ID: William Bauer, male    DOB: 05-Oct-1956  Age: 60 y.o. MRN: [id number]  CC: Asthma; Allergic Rhinitis ; and Sinusitis   HPI Kaylen Nghiem presents for a two-week history of sinus pressure, sore throat, chills, NP cough, and wheezing. He's had no episodes of shortness of breath, chest pain, hemoptysis, night sweats. He denies fever or headache. He has gotten moderate symptom relief with Advair Diskus and Flonase nasal spray.  Outpatient Prescriptions Prior to Visit  Medication Sig Dispense Refill  . cholecalciferol (VITAMIN D) 1000 UNITS tablet Take 1,000 Units by mouth daily.    . cyclobenzaprine (FLEXERIL) 10 MG tablet Take 1 tablet (10 mg total) by mouth at bedtime as needed for muscle spasms. 30 tablet 0  . NON FORMULARY DGL Licorice - 1 tablet three times daily    . ranitidine (ZANTAC) 150 MG tablet Take 150 mg by mouth daily as needed for heartburn.     Marland Kitchen UNABLE TO FIND Slippery [place]m - 2 times daily    . buPROPion (WELLBUTRIN SR) 150 MG 12 hr tablet Take one tablet by mouth daily for 3 days, and then increase to one tablet by mouth twice daily. (Patient taking differently: Take 300 mg by mouth 2 (two) times daily. ) 60 tablet 2  . ALOE VERA JUICE PO Take 1 tablet by mouth 3 (three) times daily.     Marland Kitchen EPINEPHrine (EPIPEN 2-PAK) 0.3 mg/0.3 mL IJ SOAJ injection Inject 0.3 mg into the muscle. Reported on 04/25/2016    . Ginger, Zingiber officinalis, (GINGER PO) Take 2 tablets by mouth daily.    . methylphenidate 27 MG PO TB24 Take 27 mg by mouth daily.    . mometasone (NASONEX) 50 MCG/ACT nasal spray Place 2 sprays into the nose daily. (Patient not taking: Reported on 04/25/2016) 17 g 2   No facility-administered medications prior to visit.    ROS Review of Systems  Constitutional: Positive for chills. Negative for fever, diaphoresis and fatigue.  HENT: Positive for congestion, postnasal drip, rhinorrhea, sinus pressure and sore throat. Negative for ear  discharge, facial swelling, sneezing, tinnitus, trouble swallowing and voice change.   Eyes: Negative.   Respiratory: Positive for cough and wheezing. Negative for choking, chest tightness, shortness of breath and stridor.   Cardiovascular: Negative.  Negative for chest pain, palpitations and leg swelling.  Gastrointestinal: Negative.  Negative for nausea, vomiting, abdominal pain, diarrhea and constipation.  Endocrine: Negative.   Genitourinary: Negative.  Negative for difficulty urinating.  Musculoskeletal: Negative.  Negative for myalgias, back pain, joint swelling and arthralgias.  Skin: Negative.  Negative for color change and rash.  Allergic/Immunologic: Negative.   Neurological: Negative.  Negative for dizziness, tremors, light-headedness and numbness.  Hematological: Negative.  Negative for adenopathy. Does not bruise/bleed easily.  Psychiatric/Behavioral: Negative.     Objective:  BP 122/82 mmHg  Pulse 83  Temp(Src) 98.9 F (37.2 C) (Oral)  Resp 16  Ht  (1.803 m)  Wt 205 lb (92.987 kg)  BMI 28.60 kg/m2  SpO2 96%  BP Readings from Last 3 Encounters:  04/25/16 122/82  04/11/16 116/86  04/05/16 118/80    Wt Readings from Last 3 Encounters:  04/25/16 205 lb (92.987 kg)  04/11/16 206 lb (93.441 kg)  04/05/16 205 lb (92.987 kg)    Physical Exam  Constitutional: He is oriented to person, place, and time.  Non-toxic appearance. He does not have a sickly appearance. He does not appear ill. No distress.  HENT:  Nose: Mucosal edema and rhinorrhea present. Right sinus exhibits no maxillary sinus tenderness. Left sinus exhibits no maxillary sinus tenderness.  Mouth/Throat: Oropharynx is clear and moist and mucous membranes are normal. Mucous membranes are not pale, not dry and not cyanotic. No oral lesions. No trismus in the jaw. No uvula swelling. No oropharyngeal exudate, posterior oropharyngeal edema, posterior oropharyngeal erythema or tonsillar abscesses.  Eyes:  Conjunctivae are normal. Right eye exhibits no discharge. Left eye exhibits no discharge. No scleral icterus.  Neck: Normal range of motion. Neck supple. No JVD present. No tracheal deviation present. No thyromegaly present.  Cardiovascular: Normal rate, regular rhythm, normal heart sounds and intact distal pulses.  Exam reveals no gallop and no friction rub.   No murmur heard. Pulmonary/Chest: Effort normal and breath sounds normal. No stridor. No respiratory distress. He has no wheezes. He has no rales. He exhibits no tenderness.  Abdominal: Soft. Bowel sounds are normal. He exhibits no distension and no mass. There is no tenderness. There is no rebound and no guarding.  Musculoskeletal: Normal range of motion. He exhibits no edema or tenderness.  Lymphadenopathy:    He has no cervical adenopathy.  Neurological: He is oriented to person, place, and time.  Skin: Skin is warm. No rash noted. He is not diaphoretic. No erythema. No pallor.  Vitals reviewed.   Lab Results  Component Value Date   WBC 8.1 03/08/2016   HGB 15.7 03/08/2016   HCT 46.4 03/08/2016   PLT 282.0 03/08/2016   GLUCOSE 98 03/08/2016   CHOL 193 12/30/2014   TRIG 179.0* 12/30/2014   HDL 45.30 12/30/2014   LDLCALC 112* 12/30/2014   NA 139 03/08/2016   K 4.3 03/08/2016   CL 104 03/08/2016   CREATININE 1.09 03/08/2016   BUN 17 03/08/2016   CO2 29 03/08/2016   TSH 1.60 03/08/2016   PSA 0.42 05/31/2015    Dg Finger Thumb Left  04/02/2016  CLINICAL DATA:  Left thumb drill bit injury EXAM: LEFT THUMB 2+V COMPARISON:  None. FINDINGS: Overlying bandage obscures fine bone detail. No fracture, dislocation or suspicious focal osseous lesion. No radiopaque foreign body. Moderate first carpometacarpal joint osteoarthritis. IMPRESSION: No fracture or dislocation.  No radiopaque foreign body. Electronically Signed   By: Delbert Phenix M.D.   On: 04/02/2016 12:35    Assessment & Plan:   Normand was seen today for asthma,  allergic rhinitis  and sinusitis.  Diagnoses and all orders for this visit:  Seasonal and perennial allergic rhinitis- he is having a flareup of allergic rhinitis so I gave him an injection of Depo-Medrol  Acute maxillary sinusitis, recurrence not specified- I will treat the infection with Levaquin -     levofloxacin (LEVAQUIN) 750 MG tablet; Take 1 tablet (750 mg total) by mouth daily. -     methylPREDNISolone acetate (DEPO-MEDROL) injection 120 mg; Inject 1.5 mLs (120 mg total) into the muscle once.  Asthma with acute exacerbation, mild intermittent- he is having a flare of asthma, will continue the Advair Diskus, I also gave him an injection of Depo-Medrol.   I have discontinued Mr. Riccardi's mometasone. I am also having him start on levofloxacin. Additionally, I am having him maintain his NON FORMULARY, ALOE VERA JUICE PO, UNABLE TO FIND, cholecalciferol, ranitidine, EPINEPHrine, cyclobenzaprine, (Ginger, Zingiber officinalis, (GINGER PO)), buPROPion, methylphenidate, fluticasone, and fluticasone-salmeterol. We administered methylPREDNISolone acetate.  Meds ordered this encounter  Medications  . buPROPion (WELLBUTRIN XL) 300 MG 24 hr tablet    Sig: Take 300 mg  by mouth.  . methylphenidate (CONCERTA) 36 MG PO CR tablet    Sig: Take 36 mg by mouth.  . fluticasone (FLONASE) 50 MCG/ACT nasal spray    Sig: Place 2 sprays into both nostrils daily.  . fluticasone-salmeterol (ADVAIR HFA) 115-21 MCG/ACT inhaler    Sig: Inhale 2 puffs into the lungs 2 (two) times daily.  Marland Kitchen. levofloxacin (LEVAQUIN) 750 MG tablet    Sig: Take 1 tablet (750 mg total) by mouth daily.    Dispense:  7 tablet    Refill:  0  . methylPREDNISolone acetate (DEPO-MEDROL) injection 120 mg    Sig:      Follow-up: Return in about 3 weeks (around 05/16/2016).  Sanda Lingerhomas Jones, MD

## 2016-04-25 NOTE — Patient Instructions (Signed)

## 2016-04-25 NOTE — Progress Notes (Signed)
Pre visit review using our clinic review tool, if applicable. No additional management support is needed unless otherwise documented below in the visit note. 

## 2016-04-28 ENCOUNTER — Ambulatory Visit (HOSPITAL_COMMUNITY)
Admission: EM | Admit: 2016-04-28 | Discharge: 2016-04-28 | Disposition: A | Discharge status: Home / Self Care | Payer: Managed Care, Other (non HMO) | Attending: Emergency Medicine | Admitting: Emergency Medicine

## 2016-04-28 ENCOUNTER — Encounter (HOSPITAL_COMMUNITY): Payer: Self-pay | Admitting: Emergency Medicine

## 2016-04-28 DIAGNOSIS — L03116 Cellulitis of left lower limb: Secondary | ICD-10-CM

## 2016-04-28 MED ORDER — ALIGN 4 MG PO CAPS: 1.0000 | ORAL_CAPSULE | Freq: Every day | ORAL | Status: DC

## 2016-04-28 MED ORDER — CLINDAMYCIN HCL 300 MG PO CAPS: 300.0000 mg | ORAL_CAPSULE | Freq: Three times a day (TID) | ORAL | Status: DC

## 2016-04-28 NOTE — ED Provider Notes (Signed)
Patient advised CSN: [id number]     Arrival date & time 04/28/16  1344 History   None    No chief complaint on file.  (Consider location/radiation/quality/duration/timing/severity/associated sxs/prior Treatment)  HPI   Patient is a 60 year old male presenting today with a complaint of 2-3 days of painful swelling and tenderness on the anterior surface of his left foot. Denies any known injury, bites, or recent breaks in the skin. Patient states he is currently on Levofloxacin for a cold that he's been on for approximately 4 days.  Past Medical History  Diagnosis Date  . Sleep apnea   . Allergy   . Sinus trouble   . Arthritis     hands   . Chicken pox   . GERD (gastroesophageal reflux disease)   . Hyperlipidemia   . Colon polyps    Past Surgical History  Procedure Laterality Date  . Cartlage removed      Right knee   Family History  Problem Relation Age of Onset  . Diabetes Maternal Aunt   . Breast cancer Maternal Aunt   . Lung cancer Father   . Hyperlipidemia Father   . Peripheral Artery Disease Mother   . Macular degeneration Mother   . Arthritis Mother   . Hyperlipidemia Mother   . Stroke Mother   . Hypertension Mother   . Heart disease Maternal Grandfather   . Hyperlipidemia Maternal Grandfather   . Heart disease Paternal Grandfather   . Hyperlipidemia Paternal Grandfather   . Alcohol abuse Maternal Uncle    Social History  Substance Use Topics  . Smoking status: Never Smoker   . Smokeless tobacco: Never Used  . Alcohol Use: 0.0 oz/week    0 Standard drinks or equivalent per week     Comment: 2 drinks monthly    Review of Systems  Constitutional: Negative.  Negative for fever and fatigue.  HENT: Negative.   Eyes: Negative.   Respiratory: Negative.   Cardiovascular: Negative.   Gastrointestinal: Negative.   Endocrine: Negative.   Genitourinary: Negative.   Musculoskeletal: Negative.   Skin: Positive for color change. Negative for wound.    Allergic/Immunologic: Negative.   Neurological: Negative.   Hematological: Negative.   Psychiatric/Behavioral: Negative.     Allergies  Penicillins and Yellow jacket venom  Home Medications   Prior to Admission medications   Medication Sig Start Date End Date Taking? Authorizing Provider  ALOE VERA JUICE PO Take 1 tablet by mouth 3 (three) times daily.     Historical Provider, MD  buPROPion (WELLBUTRIN XL) 300 MG 24 hr tablet Take 300 mg by mouth. 04/12/16   Historical Provider, MD  cholecalciferol (VITAMIN D) 1000 UNITS tablet Take 1,000 Units by mouth daily.    Historical Provider, MD  clindamycin (CLEOCIN) 300 MG capsule Take 1 capsule (300 mg total) by mouth 3 (three) times daily. 04/28/16   Servando Salinaatherine H Rossi, NP  cyclobenzaprine (FLEXERIL) 10 MG tablet Take 1 tablet (10 mg total) by mouth at bedtime as needed for muscle spasms. 03/08/16   Allegra GranaMargaret G Arnett, FNP  EPINEPHrine (EPIPEN 2-PAK) 0.3 mg/0.3 mL IJ SOAJ injection Inject 0.3 mg into the muscle. Reported on 04/25/2016 07/17/13   Historical Provider, MD  fluticasone (FLONASE) 50 MCG/ACT nasal spray Place 2 sprays into both nostrils daily.    Historical Provider, MD  fluticasone-salmeterol (ADVAIR HFA) 115-21 MCG/ACT inhaler Inhale 2 puffs into the lungs 2 (two) times daily.    Historical Provider, MD  Ginger, Zingiber officinalis, (GINGER PO) Take 2  tablets by mouth daily.    Historical Provider, MD  levofloxacin (LEVAQUIN) 750 MG tablet Take 1 tablet (750 mg total) by mouth daily. 04/25/16 05/02/16  Etta Grandchildhomas L Jones, MD  methylphenidate (CONCERTA) 36 MG PO CR tablet Take 36 mg by mouth. 04/12/16 04/12/17  Historical Provider, MD  NON FORMULARY DGL Licorice - 1 tablet three times daily    Historical Provider, MD  Probiotic Product (ALIGN) 4 MG CAPS Take 1 capsule (4 mg total) by mouth daily. 04/28/16   Servando Salinaatherine H Rossi, NP  ranitidine (ZANTAC) 150 MG tablet Take 150 mg by mouth daily as needed for heartburn.     Historical Provider, MD  UNABLE  TO FIND Currie ParisSlippery Elm - 2 times daily    Historical Provider, MD   Meds Ordered and Administered this Visit  Medications - No data to display  BP 139/95 mmHg  Pulse 80  Temp(Src) 97.9 F (36.6 C) (Oral)  Resp 16  SpO2 97% No data found.  Physical Exam  Constitutional: He appears well-developed and well-nourished. No distress.  Cardiovascular: Normal rate, regular rhythm, normal heart sounds and intact distal pulses.  Exam reveals no gallop and no friction rub.   No murmur heard. Pulmonary/Chest: Effort normal and breath sounds normal. No respiratory distress. He has no wheezes. He has no rales. He exhibits no tenderness.  Musculoskeletal: Normal range of motion. He exhibits edema and tenderness.  Skin: Skin is warm, dry and intact. No abrasion and no rash noted. He is not diaphoretic.     Nursing note and vitals reviewed.   ED Course  Procedures (including critical care time)  Labs Review Labs Reviewed - No data to display  Imaging Review No results found.   MDM   1. Cellulitis of left lower extremity    Meds ordered this encounter  Medications  . clindamycin (CLEOCIN) 300 MG capsule    Sig: Take 1 capsule (300 mg total) by mouth 3 (three) times daily.    Dispense:  30 capsule    Refill:  0  . Probiotic Product (ALIGN) 4 MG CAPS    Sig: Take 1 capsule (4 mg total) by mouth daily.    Dispense:  30 capsule    Refill:  2    Patient was advised to discontinue his current antibiotic and to start the clindamycin ASAP. In addition he was cautioned about returning should symptoms worsen or fail to improve. Lastly he was advised to eat yogurt daily and take a probiotic for the next 3 months to avoid recurrent yeast infections which he has had difficulty within the past.The patient verbalizes understanding and agrees to plan of care.      Servando Salinaatherine H Rossi, NP 04/28/16 1529

## 2016-04-28 NOTE — Discharge Instructions (Signed)

## 2016-04-28 NOTE — ED Notes (Signed)
Left foot pain and redness for 3 days.  Seen by provider only

## 2016-05-31 ENCOUNTER — Ambulatory Visit (INDEPENDENT_AMBULATORY_CARE_PROVIDER_SITE_OTHER): Payer: Managed Care, Other (non HMO) | Admitting: Emergency Medicine

## 2016-05-31 ENCOUNTER — Encounter: Payer: Self-pay | Admitting: Emergency Medicine

## 2016-05-31 DIAGNOSIS — R053 Chronic cough: Secondary | ICD-10-CM

## 2016-05-31 DIAGNOSIS — R05 Cough: Secondary | ICD-10-CM | POA: Diagnosis not present

## 2016-05-31 DIAGNOSIS — G4733 Obstructive sleep apnea (adult) (pediatric): Secondary | ICD-10-CM

## 2016-05-31 MED ORDER — OMEPRAZOLE 40 MG PO CPDR: 40.0000 mg | DELAYED_RELEASE_CAPSULE | Freq: Every day | ORAL | 5 refills | Status: DC

## 2016-05-31 MED ORDER — MOMETASONE FUROATE 50 MCG/ACT NA SUSP: 2.0000 | Freq: Every day | NASAL | 5 refills | Status: DC

## 2016-05-31 MED ORDER — HYDROCODONE-HOMATROPINE 5-1.5 MG/5ML PO SYRP: 5.0000 mL | ORAL_SOLUTION | Freq: Four times a day (QID) | ORAL | 0 refills | Status: DC | PRN

## 2016-05-31 NOTE — Patient Instructions (Signed)
Please restart omeprazole 40 mg daily for the next month Please start taking Nasonex 2 sprays each nostril once a day for the next month Please start doing her nasal saline washes once a day every day for the next month. If cough is better then you can decrease to using this as needed Depending on how her cough does you may be able to peel off the scheduled omeprazole and scheduled Nasonex after 1 month Stop Advair Continue getting allergy shots as currently scheduled Please follow with Dr Delton Coombes in 6 weeks if your cough has not improved.

## 2016-05-31 NOTE — Assessment & Plan Note (Signed)
Based on questioning and evaluation today there appears to be influence from GERD, chronic rhinitis. I suspect that probably he also has some detrimental effect from the Advair. We will try to address these issues. If his cough continues and he may need further evaluation such as chest x-ray or bronchoscopy.   Please restart omeprazole 40 mg daily for the next month Please start taking Nasonex 2 sprays each nostril once a day for the next month Please start doing her nasal saline washes once a day every day for the next month. If cough is better then you can decrease to using this as needed Depending on how her cough does you may be able to peel off the scheduled omeprazole and scheduled Nasonex after 1 month Stop Advair Continue getting allergy shots as currently scheduled Please follow with Dr Delton Coombes in 6 weeks if your cough has not improved.

## 2016-05-31 NOTE — Assessment & Plan Note (Signed)
Continue CPAP as ordered 

## 2016-05-31 NOTE — Progress Notes (Signed)
Subjective:    Patient ID: William Bauer, male    DOB: [birth year], 60 y.o.   MRN: [id number]  HPI Patient is a 60 year old never smoker with a history of allergic rhinitis, obstructive sleep apnea on CPAP, Chronic cough. He has Significant allergies to PCN and to stinging insects has been seen in the past by Dr. Maple Hudson in our office for his allergies but is currently followed in [place]. He started Advair in June to see if it would help - no real change. I do not see any Pulmonary function testing on file.  He has persistent sinus drainage, uses NSW prn, flonase. He began to have some flaring of his cough over the last 2 months. Appears to be worst at night. Non-productive. He is having trouble with GERD for years, has been off omeprazole a year ago. Started zantac 6 weeks ago. A lot of throat clearing.    Review of Systems  Respiratory: Positive for cough.     Past Medical History:  Diagnosis Date  . Allergy   . Arthritis    hands   . Chicken pox   . Colon polyps   . GERD (gastroesophageal reflux disease)   . Hyperlipidemia   . Sinus trouble   . Sleep apnea      Family History  Problem Relation Age of Onset  . Diabetes Maternal Aunt   . Breast cancer Maternal Aunt   . Lung cancer Father   . Hyperlipidemia Father   . Peripheral Artery Disease Mother   . Macular degeneration Mother   . Arthritis Mother   . Hyperlipidemia Mother   . Stroke Mother   . Hypertension Mother   . Heart disease Maternal Grandfather   . Hyperlipidemia Maternal Grandfather   . Heart disease Paternal Grandfather   . Hyperlipidemia Paternal Grandfather   . Alcohol abuse Maternal Uncle      Social History   Social History  . Marital status: Married    Spouse name: N/A  . Number of children: 1  . Years of education: 51   Occupational History  . Minister    Social History Main Topics  . Smoking status: Never Smoker  . Smokeless tobacco: Never Used  . Alcohol use 0.0 oz/week   Comment: 2 drinks monthly  . Drug use: No  . Sexual activity: Not on file   Other Topics Concern  . Not on file   Social History Narrative   Born and raised in [place], [place]. Currently resides in a house. 3 dogs. Fun: Yardwork, golf, read   Denies any religious beliefs effecting health care.        Outpatient Medications Prior to Visit  Medication Sig Dispense Refill  . ALOE VERA JUICE PO Take 1 tablet by mouth 3 (three) times daily.     Marland Kitchen buPROPion (WELLBUTRIN XL) 300 MG 24 hr tablet Take 300 mg by mouth.    . cholecalciferol (VITAMIN D) 1000 UNITS tablet Take 1,000 Units by mouth daily.    . cyclobenzaprine (FLEXERIL) 10 MG tablet Take 1 tablet (10 mg total) by mouth at bedtime as needed for muscle spasms. 30 tablet 0  . EPINEPHrine (EPIPEN 2-PAK) 0.3 mg/0.3 mL IJ SOAJ injection Inject 0.3 mg into the muscle. Reported on 04/25/2016    . fluticasone (FLONASE) 50 MCG/ACT nasal spray Place 2 sprays into both nostrils daily.    . fluticasone-salmeterol (ADVAIR HFA) 115-21 MCG/ACT inhaler Inhale 2 puffs into the lungs 2 (two) times daily.    Marland Kitchen  Ginger, Zingiber officinalis, (GINGER PO) Take 2 tablets by mouth daily.    . methylphenidate (CONCERTA) 36 MG PO CR tablet Take 36 mg by mouth.    . NON FORMULARY DGL Licorice - 1 tablet three times daily    . Probiotic Product (ALIGN) 4 MG CAPS Take 1 capsule (4 mg total) by mouth daily. 30 capsule 2  . ranitidine (ZANTAC) 150 MG tablet Take 150 mg by mouth daily as needed for heartburn.     Marland Kitchen UNABLE TO FIND Slippery Elm - 2 times daily    . clindamycin (CLEOCIN) 300 MG capsule Take 1 capsule (300 mg total) by mouth 3 (three) times daily. 30 capsule 0   No facility-administered medications prior to visit.          Objective:   Physical Exam Vitals:   05/31/16 1103  BP: 114/72  Pulse: 80  SpO2: 97%  Weight: 203 lb (92.1 kg)  Height:  (1.803 m)   Gen: Pleasant, well-nourished, in no distress,  normal affect  ENT: No  lesions,  mouth clear,  oropharynx clear, posterior pharyngeal erythema  Neck: No JVD, no TMG, no carotid bruits  Lungs: No use of accessory muscles, clear without rales or rhonchi  Cardiovascular: RRR, heart sounds normal, no murmur or gallops, no peripheral edema  Musculoskeletal: No deformities, no cyanosis or clubbing  Neuro: alert, non focal                                 Skin: Warm, no lesions or rashes     Assessment & Plan:  Obstructive sleep apnea Continue CPAP as ordered  Chronic cough Based on questioning and evaluation today there appears to be influence from GERD, chronic rhinitis. I suspect that probably he also has some detrimental effect from the Advair. We will try to address these issues. If his cough continues and he may need further evaluation such as chest x-ray or bronchoscopy.   Please restart omeprazole 40 mg daily for the next month Please start taking Nasonex 2 sprays each nostril once a day for the next month Please start doing her nasal saline washes once a day every day for the next month. If cough is better then you can decrease to using this as needed Depending on how her cough does you may be able to peel off the scheduled omeprazole and scheduled Nasonex after 1 month Stop Advair Continue getting allergy shots as currently scheduled Please follow with Dr Delton Coombes in 6 weeks if your cough has not improved.    Levy Pupa, MD, PhD 05/31/2016, 12:43 PM [facility] [phone] or if no answer [phone]

## 2016-06-12 ENCOUNTER — Encounter: Payer: Self-pay | Admitting: Family Medicine

## 2016-06-12 ENCOUNTER — Ambulatory Visit (INDEPENDENT_AMBULATORY_CARE_PROVIDER_SITE_OTHER): Payer: Managed Care, Other (non HMO) | Admitting: Family Medicine

## 2016-06-12 VITALS — BP 122/86 | HR 78 | Temp 97.4°F | Ht 71.0 in | Wt 202.9 lb

## 2016-06-12 DIAGNOSIS — H109 Unspecified conjunctivitis: Secondary | ICD-10-CM

## 2016-06-12 MED ORDER — ERYTHROMYCIN 5 MG/GM OP OINT: 1.0000 "application " | TOPICAL_OINTMENT | Freq: Every day | OPHTHALMIC | 0 refills | Status: DC

## 2016-06-12 NOTE — Progress Notes (Signed)
HPI:  William Bauer is a very pleasant 60 year old here for an acute visit for "pinkeye." He reports his symptoms started about a week ago. He has had itchy and irritated eyes, drainage from both eyes, crusting the corners of the eyes. Denies fevers, swelling around the eyes, blurred vision or double vision, headaches, nausea or sinus symptoms. He wears contacts, but has been wearing his glasses for the last week. He has tried some over-the-counter allergy treatments.  ROS: See pertinent positives and negatives per HPI.  Past Medical History:  Diagnosis Date  . Allergy   . Arthritis    hands   . Chicken pox   . Colon polyps   . GERD (gastroesophageal reflux disease)   . Hyperlipidemia   . Sinus trouble   . Sleep apnea     Past Surgical History:  Procedure Laterality Date  . Cartlage removed     Right knee    Family History  Problem Relation Age of Onset  . Diabetes Maternal Aunt   . Breast cancer Maternal Aunt   . Lung cancer Father   . Hyperlipidemia Father   . Peripheral Artery Disease Mother   . Macular degeneration Mother   . Arthritis Mother   . Hyperlipidemia Mother   . Stroke Mother   . Hypertension Mother   . Heart disease Maternal Grandfather   . Hyperlipidemia Maternal Grandfather   . Heart disease Paternal Grandfather   . Hyperlipidemia Paternal Grandfather   . Alcohol abuse Maternal Uncle     Social History   Social History  . Marital status: Married    Spouse name: N/A  . Number of children: 1  . Years of education: 3919   Occupational History  . Minister    Social History Main Topics  . Smoking status: Never Smoker  . Smokeless tobacco: Never Used  . Alcohol use 0.0 oz/week     Comment: 2 drinks monthly  . Drug use: No  . Sexual activity: Not Asked   Other Topics Concern  . None   Social History Narrative   [place]. Currently resides in a house. 3 dogs. Fun: Yardwork, golf, read   Denies any religious  beliefs effecting health care.      Current Outpatient Prescriptions:  .  ALOE VERA JUICE PO, Take 1 tablet by mouth 3 (three) times daily. , Disp: , Rfl:  .  buPROPion (WELLBUTRIN XL) 300 MG 24 hr tablet, Take 300 mg by mouth., Disp: , Rfl:  .  cholecalciferol (VITAMIN D) 1000 UNITS tablet, Take 1,000 Units by mouth daily., Disp: , Rfl:  .  cyclobenzaprine (FLEXERIL) 10 MG tablet, Take 1 tablet (10 mg total) by mouth at bedtime as needed for muscle spasms., Disp: 30 tablet, Rfl: 0 .  EPINEPHrine (EPIPEN 2-PAK) 0.3 mg/0.3 mL IJ SOAJ injection, Inject 0.3 mg into the muscle. Reported on 04/25/2016, Disp: , Rfl:  .  Ginger, Zingiber officinalis, (GINGER PO), Take 2 tablets by mouth daily., Disp: , Rfl:  .  HYDROcodone-homatropine (HYCODAN) 5-1.5 MG/5ML syrup, Take 5 mLs by mouth every 6 (six) hours as needed for cough., Disp: 120 mL, Rfl: 0 .  methylphenidate (CONCERTA) 36 MG PO CR tablet, Take 36 mg by mouth., Disp: , Rfl:  .  mometasone (NASONEX) 50 MCG/ACT nasal spray, Place 2 sprays into the nose daily., Disp: 17 g, Rfl: 5 .  NON FORMULARY, DGL Licorice - 1 tablet three times daily, Disp: , Rfl:  .  omeprazole (PRILOSEC)  40 MG capsule, Take 1 capsule (40 mg total) by mouth daily., Disp: 30 capsule, Rfl: 5 .  Probiotic Product (ALIGN) 4 MG CAPS, Take 1 capsule (4 mg total) by mouth daily., Disp: 30 capsule, Rfl: 2 .  ranitidine (ZANTAC) 150 MG tablet, Take 150 mg by mouth daily as needed for heartburn. , Disp: , Rfl:  .  UNABLE TO FIND, Slippery Elm - 2 times daily, Disp: , Rfl:  .  erythromycin ophthalmic ointment, Place 1 application into the left eye at bedtime. For 5-7 days., Disp: 3.5 g, Rfl: 0  EXAM:  Vitals:   06/12/16 1624  BP: 122/86  Pulse: 78  Temp: 97.4 F (36.3 C)    Body mass index is 28.3 kg/m.  GENERAL: vitals reviewed and listed above, alert, oriented, appears well hydrated and in no acute distress  HEENT: atraumatic, conjunttiva Injected bilaterally, clear  drainage, small amount of purulent discharge corners of both eyes, visual acuity grossly intact, PERRLA, EOMI, no obvious abnormalities on inspection of external nose and ears  NECK: no obvious masses on inspection  MS: moves all extremities without noticeable abnormality  PSYCH: pleasant and cooperative, no obvious depression or anxiety  ASSESSMENT AND PLAN:  Discussed the following assessment and plan:  Bilateral conjunctivitis  Discussed treatment options, risk, potential complications and return precautions. -Patient advised to return or notify a doctor immediately if symptoms worsen or persist or new concerns arise.  Patient Instructions  Compresses several times per day.  Use the antibiotic ointment before bed.   Seek care if worsening, new symptoms or symptoms do not respond to treatment.  I hope you are feeling better soon!   Bacterial Conjunctivitis Bacterial conjunctivitis, commonly called pink eye, is an inflammation of the clear membrane that covers the white part of the eye (conjunctiva). The inflammation can also happen on the underside of the eyelids. The blood vessels in the conjunctiva become inflamed, causing the eye to become red or pink. Bacterial conjunctivitis may spread easily from one eye to another and from person to person (contagious).  CAUSES  Bacterial conjunctivitis is caused by bacteria. The bacteria may come from your own skin, your upper respiratory tract, or from someone else with bacterial conjunctivitis. SYMPTOMS  The normally white color of the eye or the underside of the eyelid is usually pink or red. The pink eye is usually associated with irritation, tearing, and some sensitivity to light. Bacterial conjunctivitis is often associated with a thick, yellowish discharge from the eye. The discharge may turn into a crust on the eyelids overnight, which causes your eyelids to stick together. If a discharge is present, there may also be some blurred  vision in the affected eye. DIAGNOSIS  Bacterial conjunctivitis is diagnosed by your caregiver through an eye exam and the symptoms that you report. Your caregiver looks for changes in the surface tissues of your eyes, which may point to the specific type of conjunctivitis. A sample of any discharge may be collected on a cotton-tip swab if you have a severe case of conjunctivitis, if your cornea is affected, or if you keep getting repeat infections that do not respond to treatment. The sample will be sent to a lab to see if the inflammation is caused by a bacterial infection and to see if the infection will respond to antibiotic medicines. TREATMENT   Bacterial conjunctivitis is treated with antibiotics. HOME CARE INSTRUCTIONS   To ease discomfort, apply a cool, clean washcloth to your eye for 10-20 minutes, 3-4  times a day.  Gently wipe away any drainage from your eye with a warm, wet washcloth or a cotton ball.  Wash your hands often with soap and water. Use paper towels to dry your hands.  Do not share towels or washcloths. This may spread the infection.  Change or wash your pillowcase every day.  You should not use eye makeup until the infection is gone.  Do not operate machinery or drive if your vision is blurred.  Stop using contact lenses. Ask your caregiver how to sterilize or replace your contacts before using them again. This depends on the type of contact lenses that you use.  When applying medicine to the infected eye, do not touch the edge of your eyelid with the eyedrop bottle or ointment tube. SEEK IMMEDIATE MEDICAL CARE IF:   Your infection has not improved within 3 days after beginning treatment.  You had yellow discharge from your eye and it returns.  You have increased eye pain.  Your eye redness is spreading.  Your vision becomes blurred.  You have a fever or persistent symptoms for more than 2-3 days.  You have a fever and your symptoms suddenly get  worse.  You have facial pain, redness, or swelling. MAKE SURE YOU:   Understand these instructions.  Will watch your condition.  Will get help right away if you are not doing well or get worse.   This information is not intended to replace advice given to you by your health care provider. Make sure you discuss any questions you have with your health care provider.   Document Released: 10/22/2005 Document Revised: 11/12/2014 Document Reviewed: 03/24/2012 Elsevier Interactive Patient Education [address].    [place], Dahlia Client R., DO

## 2016-06-12 NOTE — Progress Notes (Signed)
Pre visit review using our clinic review tool, if applicable. No additional management support is needed unless otherwise documented below in the visit note. 

## 2016-06-12 NOTE — Patient Instructions (Signed)
Compresses several times per day.  Use the antibiotic ointment before bed.   Seek care if worsening, new symptoms or symptoms do not respond to treatment.  I hope you are feeling better soon!   Bacterial Conjunctivitis Bacterial conjunctivitis, commonly called pink eye, is an inflammation of the clear membrane that covers the white part of the eye (conjunctiva). The inflammation can also happen on the underside of the eyelids. The blood vessels in the conjunctiva become inflamed, causing the eye to become red or pink. Bacterial conjunctivitis may spread easily from one eye to another and from person to person (contagious).  CAUSES  Bacterial conjunctivitis is caused by bacteria. The bacteria may come from your own skin, your upper respiratory tract, or from someone else with bacterial conjunctivitis. SYMPTOMS  The normally white color of the eye or the underside of the eyelid is usually pink or red. The pink eye is usually associated with irritation, tearing, and some sensitivity to light. Bacterial conjunctivitis is often associated with a thick, yellowish discharge from the eye. The discharge may turn into a crust on the eyelids overnight, which causes your eyelids to stick together. If a discharge is present, there may also be some blurred vision in the affected eye. DIAGNOSIS  Bacterial conjunctivitis is diagnosed by your caregiver through an eye exam and the symptoms that you report. Your caregiver looks for changes in the surface tissues of your eyes, which may point to the specific type of conjunctivitis. A sample of any discharge may be collected on a cotton-tip swab if you have a severe case of conjunctivitis, if your cornea is affected, or if you keep getting repeat infections that do not respond to treatment. The sample will be sent to a lab to see if the inflammation is caused by a bacterial infection and to see if the infection will respond to antibiotic medicines. TREATMENT    Bacterial conjunctivitis is treated with antibiotics. HOME CARE INSTRUCTIONS   To ease discomfort, apply a cool, clean washcloth to your eye for 10-20 minutes, 3-4 times a day.  Gently wipe away any drainage from your eye with a warm, wet washcloth or a cotton ball.  Wash your hands often with soap and water. Use paper towels to dry your hands.  Do not share towels or washcloths. This may spread the infection.  Change or wash your pillowcase every day.  You should not use eye makeup until the infection is gone.  Do not operate machinery or drive if your vision is blurred.  Stop using contact lenses. Ask your caregiver how to sterilize or replace your contacts before using them again. This depends on the type of contact lenses that you use.  When applying medicine to the infected eye, do not touch the edge of your eyelid with the eyedrop bottle or ointment tube. SEEK IMMEDIATE MEDICAL CARE IF:   Your infection has not improved within 3 days after beginning treatment.  You had yellow discharge from your eye and it returns.  You have increased eye pain.  Your eye redness is spreading.  Your vision becomes blurred.  You have a fever or persistent symptoms for more than 2-3 days.  You have a fever and your symptoms suddenly get worse.  You have facial pain, redness, or swelling. MAKE SURE YOU:   Understand these instructions.  Will watch your condition.  Will get help right away if you are not doing well or get worse.   This information is not intended to replace  advice given to you by your health care provider. Make sure you discuss any questions you have with your health care provider.   Document Released: 10/22/2005 Document Revised: 11/12/2014 Document Reviewed: 03/24/2012 Elsevier Interactive Patient Education [employer]

## 2016-07-11 ENCOUNTER — Ambulatory Visit (INDEPENDENT_AMBULATORY_CARE_PROVIDER_SITE_OTHER): Payer: Managed Care, Other (non HMO) | Admitting: Emergency Medicine

## 2016-07-11 ENCOUNTER — Encounter: Payer: Self-pay | Admitting: Emergency Medicine

## 2016-07-11 VITALS — BP 124/82 | HR 84 | Ht 70.5 in | Wt 202.0 lb

## 2016-07-11 DIAGNOSIS — R05 Cough: Secondary | ICD-10-CM

## 2016-07-11 DIAGNOSIS — R053 Chronic cough: Secondary | ICD-10-CM

## 2016-07-11 NOTE — Assessment & Plan Note (Signed)
Please continue your omeprazole and ranitidine as you are taking them  Continue your allergy shots  Continue Rhinocort  Try starting loratadine 10mg daily We will arrange for pulmonary function testing We will arrange for bronchoscopy to inspect your airways. If your cough improves then we may be able to cancel this test.  Follow with Dr Byrum next available 

## 2016-07-11 NOTE — Patient Instructions (Signed)
Please continue your omeprazole and ranitidine as you are taking them  Continue your allergy shots  Continue Rhinocort  Try starting loratadine 10mg  daily We will arrange for pulmonary function testing We will arrange for bronchoscopy to inspect your airways. If your cough improves then we may be able to cancel this test.  Follow with Dr Delton CoombesByrum next available

## 2016-07-11 NOTE — Progress Notes (Signed)
Subjective:    Patient ID: William Bauer, male    DOB: 13-May-1956, 60 y.o.   MRN: [id number]  HPI Patient is a 60 year old never smoker with a history of allergic rhinitis, obstructive sleep apnea on CPAP, Chronic cough. He has Significant allergies to PCN and to stinging insects has been seen in the past by Dr. Maple Hudson in our office for his allergies but is currently followed in [place]. He started Advair in June to see if it would help - no real change. I do not see any Pulmonary function testing on file.  He has persistent sinus drainage, uses NSW prn, flonase. He began to have some flaring of his cough over the last 2 months. Appears to be worst at night. Non-productive. He is having trouble with GERD for years, has been off omeprazole a year ago. Started zantac 6 weeks ago. A lot of throat clearing.    ROV 07/11/16 -- This is a follow-up visit for chronic cough. Based on our initial evaluation I suspected that both GERD and chronic rhinitis were contributing.  He has used omeprazole + zantac and nasal steroid.  He may have improved some, most improvement over the last couple weeks.  Still with symptoms however. Concerning that Fall allergy season is coming and that this may flare him more.    Review of Systems  Respiratory: Positive for cough.     Past Medical History:  Diagnosis Date  . Allergy   . Arthritis    hands   . Chicken pox   . Colon polyps   . GERD (gastroesophageal reflux disease)   . Hyperlipidemia   . Sinus trouble   . Sleep apnea      Family History  Problem Relation Age of Onset  . Diabetes Maternal Aunt   . Breast cancer Maternal Aunt   . Lung cancer Father   . Hyperlipidemia Father   . Peripheral Artery Disease Mother   . Macular degeneration Mother   . Arthritis Mother   . Hyperlipidemia Mother   . Stroke Mother   . Hypertension Mother   . Heart disease Maternal Grandfather   . Hyperlipidemia Maternal Grandfather   . Heart disease Paternal  Grandfather   . Hyperlipidemia Paternal Grandfather   . Alcohol abuse Maternal Uncle      Social History   Social History  . Marital status: Married    Spouse name: N/A  . Number of children: 1  . Years of education: 3   Occupational History  . Minister    Social History Main Topics  . Smoking status: Never Smoker  . Smokeless tobacco: Never Used  . Alcohol use 0.0 oz/week     Comment: 2 drinks monthly  . Drug use: No  . Sexual activity: Not on file   Other Topics Concern  . Not on file   Social History Narrative   Born and raised in [place], [place]. Currently resides in a house. 3 dogs. Fun: Yardwork, golf, read   Denies any religious beliefs effecting health care.        Outpatient Medications Prior to Visit  Medication Sig Dispense Refill  . ALOE VERA JUICE PO Take 1 tablet by mouth 3 (three) times daily.     Marland Kitchen buPROPion (WELLBUTRIN XL) 300 MG 24 hr tablet Take 300 mg by mouth.    . cholecalciferol (VITAMIN D) 1000 UNITS tablet Take 1,000 Units by mouth daily.    . cyclobenzaprine (FLEXERIL) 10 MG tablet Take 1 tablet (10 mg  total) by mouth at bedtime as needed for muscle spasms. 30 tablet 0  . EPINEPHrine (EPIPEN 2-PAK) 0.3 mg/0.3 mL IJ SOAJ injection Inject 0.3 mg into the muscle. Reported on 04/25/2016    . erythromycin ophthalmic ointment Place 1 application into the left eye at bedtime. For 5-7 days. 3.5 g 0  . Ginger, Zingiber officinalis, (GINGER PO) Take 2 tablets by mouth daily.    Marland Kitchen. HYDROcodone-homatropine (HYCODAN) 5-1.5 MG/5ML syrup Take 5 mLs by mouth every 6 (six) hours as needed for cough. 120 mL 0  . methylphenidate (CONCERTA) 36 MG PO CR tablet Take 36 mg by mouth.    . mometasone (NASONEX) 50 MCG/ACT nasal spray Place 2 sprays into the nose daily. 17 g 5  . NON FORMULARY DGL Licorice - 1 tablet three times daily    . omeprazole (PRILOSEC) 40 MG capsule Take 1 capsule (40 mg total) by mouth daily. 30 capsule 5  . Probiotic Product (ALIGN) 4 MG  CAPS Take 1 capsule (4 mg total) by mouth daily. 30 capsule 2  . ranitidine (ZANTAC) 150 MG tablet Take 150 mg by mouth daily as needed for heartburn.     Marland Kitchen. UNABLE TO FIND Slippery Elm - 2 times daily     No facility-administered medications prior to visit.          Objective:   Physical Exam Vitals:   07/11/16 1452 07/11/16 1453  BP:  124/82  Pulse:  84  SpO2:  97%  Weight: 202 lb (91.6 kg)   Height: 5' 10.5" (1.791 m)    Gen: Pleasant, well-nourished, in no distress,  normal affect  ENT: No lesions,  mouth clear,  oropharynx clear, posterior pharyngeal erythema  Neck: No JVD, no TMG, no carotid bruits  Lungs: No use of accessory muscles, clear without rales or rhonchi  Cardiovascular: RRR, heart sounds normal, no murmur or gallops, no peripheral edema  Musculoskeletal: No deformities, no cyanosis or clubbing  Neuro: alert, non focal                                 Skin: Warm, no lesions or rashes     Assessment & Plan:  Chronic cough Please continue your omeprazole and ranitidine as you are taking them  Continue your allergy shots  Continue Rhinocort  Try starting loratadine 10mg  daily We will arrange for pulmonary function testing We will arrange for bronchoscopy to inspect your airways. If your cough improves then we may be able to cancel this test.  Follow with Dr Delton CoombesByrum next available   Levy Pupaobert Byrum, MD, PhD 07/11/2016, 3:28 PM [facility] [phone] or if no answer [phone]

## 2016-07-12 ENCOUNTER — Ambulatory Visit (INDEPENDENT_AMBULATORY_CARE_PROVIDER_SITE_OTHER): Payer: Managed Care, Other (non HMO) | Admitting: Emergency Medicine

## 2016-07-12 DIAGNOSIS — R053 Chronic cough: Secondary | ICD-10-CM

## 2016-07-12 DIAGNOSIS — R05 Cough: Secondary | ICD-10-CM | POA: Diagnosis not present

## 2016-07-12 LAB — PULMONARY FUNCTION TEST
DL/VA % pred: 96 %
DL/VA: 4.52 ml/min/mmHg/L
DLCO COR % PRED: 92 %
DLCO COR: 30.99 ml/min/mmHg
DLCO UNC % PRED: 96 %
DLCO unc: 32.54 ml/min/mmHg
FEF 25-75 POST: 3.98 L/s
FEF 25-75 PRE: 4.1 L/s
FEF2575-%Change-Post: -2 %
FEF2575-%PRED-PRE: 130 %
FEF2575-%Pred-Post: 126 %
FEV1-%Change-Post: 0 %
FEV1-%PRED-POST: 108 %
FEV1-%PRED-PRE: 108 %
FEV1-POST: 4.11 L
FEV1-Pre: 4.12 L
FEV1FVC-%Change-Post: 1 %
FEV1FVC-%PRED-PRE: 106 %
FEV6-%CHANGE-POST: -1 %
FEV6-%Pred-Post: 104 %
FEV6-%Pred-Pre: 106 %
FEV6-POST: 4.98 L
FEV6-Pre: 5.07 L
FEV6FVC-%Change-Post: 0 %
FEV6FVC-%PRED-POST: 104 %
FEV6FVC-%Pred-Pre: 103 %
FVC-%Change-Post: -1 %
FVC-%PRED-POST: 100 %
FVC-%PRED-PRE: 102 %
FVC-POST: 5.03 L
FVC-Pre: 5.11 L
PRE FEV1/FVC RATIO: 81 %
Post FEV1/FVC ratio: 82 %
Post FEV6/FVC ratio: 100 %
Pre FEV6/FVC Ratio: 99 %
RV % pred: 50 %
RV: 1.13 L
TLC % PRED: 89 %
TLC: 6.47 L

## 2016-08-01 ENCOUNTER — Encounter (HOSPITAL_COMMUNITY): Payer: Managed Care, Other (non HMO)

## 2016-08-02 ENCOUNTER — Encounter: Payer: Self-pay | Admitting: *Deleted

## 2016-08-07 ENCOUNTER — Telehealth: Payer: Self-pay | Admitting: Emergency Medicine

## 2016-08-07 NOTE — Telephone Encounter (Signed)
OK - thanks

## 2016-08-07 NOTE — Telephone Encounter (Signed)
LMTCB for William Bauer  RB - FYI pt has cancelled Bronch for tomorrow. Paged.

## 2016-08-08 ENCOUNTER — Ambulatory Visit (HOSPITAL_COMMUNITY)
Admission: RE | Admit: 2016-08-08 | Payer: Managed Care, Other (non HMO) | Source: Ambulatory Visit | Admitting: Emergency Medicine

## 2016-08-08 ENCOUNTER — Encounter (HOSPITAL_COMMUNITY): Admission: RE | Payer: Self-pay | Source: Ambulatory Visit

## 2016-08-08 ENCOUNTER — Inpatient Hospital Stay (HOSPITAL_COMMUNITY): Admission: RE | Admit: 2016-08-08 | Payer: Managed Care, Other (non HMO) | Source: Ambulatory Visit

## 2016-08-08 ENCOUNTER — Encounter (HOSPITAL_COMMUNITY): Payer: Managed Care, Other (non HMO)

## 2016-08-08 SURGERY — VIDEO BRONCHOSCOPY WITHOUT FLUORO
Anesthesia: Moderate Sedation | Laterality: Bilateral

## 2016-08-17 ENCOUNTER — Encounter: Payer: Self-pay | Admitting: Cardiology

## 2016-08-17 ENCOUNTER — Ambulatory Visit (INDEPENDENT_AMBULATORY_CARE_PROVIDER_SITE_OTHER): Payer: Managed Care, Other (non HMO) | Admitting: Cardiology

## 2016-08-17 VITALS — BP 140/86 | HR 87 | Ht 70.5 in | Wt 203.8 lb

## 2016-08-17 DIAGNOSIS — Z8249 Family history of ischemic heart disease and other diseases of the circulatory system: Secondary | ICD-10-CM | POA: Diagnosis not present

## 2016-08-17 DIAGNOSIS — Z0000 Encounter for general adult medical examination without abnormal findings: Secondary | ICD-10-CM | POA: Diagnosis not present

## 2016-08-17 LAB — CBC
HCT: 45.6 % (ref 38.5–50.0)
HEMOGLOBIN: 15.5 g/dL (ref 13.2–17.1)
MCH: 28.9 pg (ref 27.0–33.0)
MCHC: 34 g/dL (ref 32.0–36.0)
MCV: 84.9 fL (ref 80.0–100.0)
MPV: 8.4 fL (ref 7.5–12.5)
Platelets: 256 10*3/uL (ref 140–400)
RBC: 5.37 MIL/uL (ref 4.20–5.80)
RDW: 13.6 % (ref 11.0–15.0)
WBC: 7 10*3/uL (ref 3.8–10.8)

## 2016-08-17 LAB — BASIC METABOLIC PANEL
BUN: 14 mg/dL (ref 7–25)
CO2: 24 mmol/L (ref 20–31)
CREATININE: 0.99 mg/dL (ref 0.70–1.33)
Calcium: 9.3 mg/dL (ref 8.6–10.3)
Chloride: 103 mmol/L (ref 98–110)
GLUCOSE: 69 mg/dL (ref 65–99)
Potassium: 4.1 mmol/L (ref 3.5–5.3)
Sodium: 139 mmol/L (ref 135–146)

## 2016-08-17 LAB — LIPID PANEL
CHOLESTEROL: 213 mg/dL — AB (ref 125–200)
HDL: 47 mg/dL (ref >=40)
LDL Cholesterol: 134 mg/dL — ABNORMAL HIGH (ref <130)
TRIGLYCERIDES: 159 mg/dL — AB (ref <150)
Total CHOL/HDL Ratio: 4.5 Ratio (ref <=5.0)
VLDL: 32 mg/dL — ABNORMAL HIGH (ref <30)

## 2016-08-17 MED ORDER — ASPIRIN EC 81 MG PO TBEC: 81.0000 mg | DELAYED_RELEASE_TABLET | Freq: Every day | ORAL | 3 refills | Status: DC

## 2016-08-17 NOTE — Patient Instructions (Signed)
Medication Instructions:  Please start ASA 81 mg a day. Continue all other medications as listed.  Labwork: Please have blood work today (CBC, BMP and Lipid)  Testing/Procedures: Please have a Calcium Score.  Your physician has requested that you have cardiac calcium score. Cardiac computed tomography (CT) is a painless test that uses an x-ray machine to take clear, detailed pictures of your heart.   Follow-Up: Follow up as needed after testing.  If you need a refill on your cardiac medications before your next appointment, please call your pharmacy.  Thank you for choosing [facility]!!

## 2016-08-19 NOTE — Progress Notes (Signed)
PCP: Dr Yetta BarreJones  60 yo with history of OSA and allergic rhinitis/chronic cough presents for cardiology evaluation/risk stratification.  He has had no prior cardiac history.  Good exercise tolerance . He plays golf and does some swimming.  No exertional dyspnea or chest pain. He has a chronic cough and has been undergoing workup with pulmonary.  BP is upper normal today.  No recent lipids.    ECG: NSR, normal  PMH: 1. Depression 2. OSA: Uses CPAP 3. Allergic rhinitis 4. GERD 5. PFTs (9/17): normal.   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  Married.  Nonsmoker.   FH: PAD, MI in mother.  Father with CAD, lung cancer.   ROS: All systems reviewed and negative except as per HPI.  Current Outpatient Prescriptions  Medication Sig Dispense Refill  . buPROPion (WELLBUTRIN XL) 300 MG 24 hr tablet Take 300 mg by mouth.    . cholecalciferol (VITAMIN D) 1000 UNITS tablet Take 1,000 Units by mouth daily.    . cyanocobalamin 1000 MCG tablet Take 1,000 mcg by mouth daily.    . cyclobenzaprine (FLEXERIL) 10 MG tablet Take 1 tablet (10 mg total) by mouth at bedtime as needed for muscle spasms. 30 tablet 0  . EPINEPHrine (EPIPEN 2-PAK) 0.3 mg/0.3 mL IJ SOAJ injection Inject 0.3 mg into the muscle. Reported on 04/25/2016    . Fluticasone-Salmeterol (ADVAIR HFA IN) Inhale into the lungs as directed.    . methylphenidate (CONCERTA) 36 MG PO CR tablet Take 36 mg by mouth.    . mometasone (NASONEX) 50 MCG/ACT nasal spray Place 2 sprays into the nose daily. 17 g 5  . Multiple Vitamin (MULTIVITAMIN) tablet Take 1 tablet by mouth daily.    . ranitidine (ZANTAC) 150 MG tablet Take 150 mg by mouth daily as needed for heartburn.     . terbinafine (LAMISIL) 1 % cream Apply 1 application topically as directed.    . terbinafine (LAMISIL) 250 MG tablet Take 250 mg by mouth daily.    Marland Kitchen. aspirin EC 81 MG tablet Take 1 tablet (81 mg total) by mouth daily. 90 tablet 3   No current  facility-administered medications for this visit.    BP 140/86   Pulse 87   Ht 5' 10.5" (1.791 m)   Wt 203 lb 12.8 oz (92.4 kg)   SpO2 97%   BMI 28.83 kg/m  General: NAD Neck: No JVD, no thyromegaly or thyroid nodule.  Lungs: Clear to auscultation bilaterally with normal respiratory effort. CV: Nondisplaced PMI.  Heart regular S1/S2, no S3/S4, no murmur.  No peripheral edema.  No carotid bruit.  Normal pedal pulses.  Abdomen: Soft, nontender, no hepatosplenomegaly, no distention.  Skin: Intact without lesions or rashes.  Neurologic: Alert and oriented x 3.  Psych: Normal affect. Extremities: No clubbing or cyanosis.  HEENT: Normal.   Assessment/Plan: 1. OSA: Uses CPAP regularly.  2. Coronary artery disease risk: No ischemic symptoms.  Nonsmoker.  Mother with MI but not premature.  - Reasonable to start ASA 81 daily.  - Check lipids today.  - To further risk stratify, I will arrange for coronary artery calcium scoring .   He will followup prn.  I will decide on his need for statin based on lipids and calcium score.   William Bauer 08/19/2016

## 2016-08-21 ENCOUNTER — Ambulatory Visit (INDEPENDENT_AMBULATORY_CARE_PROVIDER_SITE_OTHER): Payer: Managed Care, Other (non HMO) | Admitting: Emergency Medicine

## 2016-08-21 ENCOUNTER — Encounter: Payer: Self-pay | Admitting: Emergency Medicine

## 2016-08-21 DIAGNOSIS — K2100 Gastro-esophageal reflux disease with esophagitis, without bleeding: Secondary | ICD-10-CM

## 2016-08-21 DIAGNOSIS — G4733 Obstructive sleep apnea (adult) (pediatric): Secondary | ICD-10-CM | POA: Diagnosis not present

## 2016-08-21 DIAGNOSIS — J3089 Other allergic rhinitis: Secondary | ICD-10-CM

## 2016-08-21 DIAGNOSIS — R05 Cough: Secondary | ICD-10-CM

## 2016-08-21 DIAGNOSIS — J302 Other seasonal allergic rhinitis: Secondary | ICD-10-CM

## 2016-08-21 DIAGNOSIS — R053 Chronic cough: Secondary | ICD-10-CM

## 2016-08-21 MED ORDER — AZELASTINE HCL 0.1 % NA SOLN: 2.0000 | Freq: Two times a day (BID) | NASAL | 12 refills | Status: DC | PRN

## 2016-08-21 NOTE — Patient Instructions (Addendum)
We will stop your nasonex for now. See if your congestion and cough are stable then stay off of it Start astelin 2 sprays each nostril 2-3x a day as needed for nasal congestion.  Work on getting the water damage and mold repaired in the home.  Continue your nasal wash spray as you are doing it  We will not restart Advair at this time.  Please follow up as needed, especially if your cough begins to increase again.

## 2016-08-21 NOTE — Progress Notes (Signed)
Subjective:    Patient ID: William Bauer, male    DOB: Mar 09, 1956, 60 y.o.   MRN: [id number]  HPI Patient is a 60 year old never smoker with a history of allergic rhinitis, obstructive sleep apnea on CPAP, Chronic cough. He has Significant allergies to PCN and to stinging insects has been seen in the past by Dr. Maple Hudson in our office for his allergies but is currently followed in [place]. He started Advair in June to see if it would help - no real change. I do not see any Pulmonary function testing on file.  He has persistent sinus drainage, uses [place] prn, flonase. He began to have some flaring of his cough over the last 2 months. Appears to be worst at night. Non-productive. He is having trouble with GERD for years, has been off omeprazole a year ago. Started zantac 6 weeks ago. A lot of throat clearing.    ROV 07/11/16 -- This is a follow-up visit for chronic cough. Based on our initial evaluation I suspected that both GERD and chronic rhinitis were contributing.  He has used omeprazole + zantac and nasal steroid.  He may have improved some, most improvement over the last couple weeks.  Still with symptoms however. Concerning that Fall allergy season is coming and that this may flare him more.   ROV 08/21/16 --  60 year old man with a history of chronic cough with suspected contributions from both GERD and chronic rhinitis, sinus disease. Since our last visit he had pulmonary function testing on 07/12/16 that I personally reviewed. This does not show any evidence of obstruction, no bronchodilator response, normal volumes and normal diffusion capacity. He had been empirically treated with Advair for possible asthma but we stopped this at a prior visit. Less cough over the last 2 weeks his cough has been a bit better. He started using a different nasal spray with additive, possibly camphor. Seems to empty his sinuses better. ? Whether this was the difference vs the change in weather. He has noticed a  water / mold exposure in his home.    Review of Systems  Respiratory: Positive for cough.     Past Medical History:  Diagnosis Date  . Allergy   . Arthritis    hands   . Chicken pox   . Colon polyps   . GERD (gastroesophageal reflux disease)   . Hyperlipidemia   . Sinus trouble   . Sleep apnea      Family History  Problem Relation Age of Onset  . Diabetes Maternal Aunt   . Breast cancer Maternal Aunt   . Lung cancer Father   . Hyperlipidemia Father   . Peripheral Artery Disease Mother   . Macular degeneration Mother   . Arthritis Mother   . Hyperlipidemia Mother   . Stroke Mother   . Hypertension Mother   . Heart disease Maternal Grandfather   . Hyperlipidemia Maternal Grandfather   . Heart disease Paternal Grandfather   . Hyperlipidemia Paternal Grandfather   . Alcohol abuse Maternal Uncle      Social History   Social History  . Marital status: Married    Spouse name: N/A  . Number of children: 1  . Years of education: 24   Occupational History  . Minister    Social History Main Topics  . Smoking status: Never Smoker  . Smokeless tobacco: Never Used  . Alcohol use 0.0 oz/week     Comment: 2 drinks monthly  . Drug use: No  .  Sexual activity: Not on file   Other Topics Concern  . Not on file   Social History Narrative   [place] in [place]. Currently resides in a house. 3 dogs. Fun: Yardwork, golf, read   Denies any religious beliefs effecting health care.        Outpatient Medications Prior to Visit  Medication Sig Dispense Refill  . aspirin EC 81 MG tablet Take 1 tablet (81 mg total) by mouth daily. 90 tablet 3  . buPROPion (WELLBUTRIN XL) 300 MG 24 hr tablet Take 300 mg by mouth.    . cholecalciferol (VITAMIN D) 1000 UNITS tablet Take 1,000 Units by mouth daily.    . cyanocobalamin 1000 MCG tablet Take 1,000 mcg by mouth daily.    . cyclobenzaprine (FLEXERIL) 10 MG tablet Take 1 tablet (10 mg total) by mouth at bedtime as  needed for muscle spasms. 30 tablet 0  . EPINEPHrine (EPIPEN 2-PAK) 0.3 mg/0.3 mL IJ SOAJ injection Inject 0.3 mg into the muscle. Reported on 04/25/2016    . Fluticasone-Salmeterol (ADVAIR HFA IN) Inhale into the lungs as directed.    . methylphenidate (CONCERTA) 36 MG PO CR tablet Take 36 mg by mouth.    . mometasone (NASONEX) 50 MCG/ACT nasal spray Place 2 sprays into the nose daily. 17 g 5  . Multiple Vitamin (MULTIVITAMIN) tablet Take 1 tablet by mouth daily.    . ranitidine (ZANTAC) 150 MG tablet Take 150 mg by mouth daily as needed for heartburn.     . terbinafine (LAMISIL) 1 % cream Apply 1 application topically as directed.    . terbinafine (LAMISIL) 250 MG tablet Take 250 mg by mouth daily.     No facility-administered medications prior to visit.          Objective:   Physical Exam Vitals:   08/21/16 1603  BP: 132/82  Pulse: 99  SpO2: 96%  Weight: 92.5 kg (204 lb)  Height: 5\' 11"  (1.803 m)   Gen: Pleasant, well-nourished, in no distress,  normal affect  ENT: No lesions,  mouth clear,  oropharynx clear, posterior pharyngeal erythema  Neck: No JVD, no TMG, no carotid bruits  Lungs: No use of accessory muscles, clear without rales or rhonchi  Cardiovascular: RRR, heart sounds normal, no murmur or gallops, no peripheral edema  Musculoskeletal: No deformities, no cyanosis or clubbing  Neuro: alert, non focal                                 Skin: Warm, no lesions or rashes     Assessment & Plan:  Chronic cough His cough has improved some over the last 2 weeks. Unclear as to whether this was related to a change in the weather and his exposures versus the addition of a new nasal spray with camphor were that apparently drains his sinuses more effectively. I asked him to continue this. Remains on Nasonex, H2 blocker as needed. Importantly he has identified a possible mold exposure in his home and he is going to work on eradicating this. No evidence of asthma based on his  pulmonary function testing.  GERD (gastroesophageal reflux disease) Continue therapy as outlined above  Seasonal and perennial allergic rhinitis Continue allergy shots, therapy as outlined above  Obstructive sleep apnea CPAP every night   Levy Pupaobert Finnlee Guarnieri, MD, PhD 08/24/2016, 9:56 AM [facility] [phone] or if no answer [phone]

## 2016-08-24 ENCOUNTER — Inpatient Hospital Stay: Admission: RE | Admit: 2016-08-24 | Payer: Managed Care, Other (non HMO) | Source: Ambulatory Visit

## 2016-08-24 NOTE — Assessment & Plan Note (Addendum)
His cough has improved some over the last 2 weeks. Unclear as to whether this was related to a change in the weather and his exposures versus the addition of a new nasal spray with camphor were that apparently drains his sinuses more effectively. I asked him to continue this. Remains on Nasonex, H2 blocker as needed. Importantly he has identified a possible mold exposure in his home and he is going to work on eradicating this. No evidence of asthma based on his pulmonary function testing.

## 2016-08-24 NOTE — Assessment & Plan Note (Signed)
Continue allergy shots, therapy as outlined above

## 2016-08-24 NOTE — Assessment & Plan Note (Signed)
CPAP every night. °

## 2016-08-24 NOTE — Assessment & Plan Note (Signed)
Continue therapy as outlined above

## 2016-08-28 ENCOUNTER — Ambulatory Visit (INDEPENDENT_AMBULATORY_CARE_PROVIDER_SITE_OTHER)
Admission: RE | Admit: 2016-08-28 | Discharge: 2016-08-28 | Disposition: A | Discharge status: Home / Self Care | Payer: Self-pay | Source: Ambulatory Visit | Attending: Cardiology | Admitting: Cardiology

## 2016-08-28 DIAGNOSIS — Z8249 Family history of ischemic heart disease and other diseases of the circulatory system: Secondary | ICD-10-CM

## 2016-08-29 ENCOUNTER — Other Ambulatory Visit: Payer: Self-pay | Admitting: *Deleted

## 2016-08-29 DIAGNOSIS — E785 Hyperlipidemia, unspecified: Secondary | ICD-10-CM

## 2016-08-29 MED ORDER — ATORVASTATIN CALCIUM 20 MG PO TABS: 20.0000 mg | ORAL_TABLET | Freq: Every day | ORAL | 0 refills | Status: DC

## 2016-10-23 ENCOUNTER — Encounter: Payer: Self-pay | Admitting: Adult Health

## 2016-10-23 ENCOUNTER — Ambulatory Visit (INDEPENDENT_AMBULATORY_CARE_PROVIDER_SITE_OTHER): Payer: Managed Care, Other (non HMO) | Admitting: Adult Health

## 2016-10-23 VITALS — BP 110/78 | HR 87 | Temp 98.1°F | Ht 71.0 in | Wt 204.4 lb

## 2016-10-23 DIAGNOSIS — J0110 Acute frontal sinusitis, unspecified: Secondary | ICD-10-CM

## 2016-10-23 MED ORDER — DOXYCYCLINE HYCLATE 100 MG PO CAPS: 100.0000 mg | ORAL_CAPSULE | Freq: Two times a day (BID) | ORAL | 0 refills | Status: DC

## 2016-10-23 NOTE — Patient Instructions (Signed)
It was great meeting you today  Continue with the sinus spray and use an over the counter decongestant such as Zyrtec D or Claritin D. If you dont feel any better in the next 2 days then you can start the antibiotic.

## 2016-10-23 NOTE — Progress Notes (Signed)
Pre visit review using our clinic review tool, if applicable. No additional management support is needed unless otherwise documented below in the visit note. 

## 2016-10-23 NOTE — Progress Notes (Signed)
Subjective:    Patient ID: Minette Headlandnthony Blumenstein, male    DOB: 1956/08/13, 60 y.o.   MRN: [id number]  HPI  60 year old male, patient of Dr. Jonny RuizJohn, who I am seeing for the first time today. In reviewing his chart, it appears as though he has a chronic cough and sees Dr. Delton CoombesByrum for this. He last saw Dr. Delton CoombesByrum on 08/21/2016   He reports that his cough has pretty much resolved. Today in the office he complains of "severe sinus drainage x 3 weeks." He does reports that he has sinus pain and pressure and headaches. He feels as though this is worse then he normally does with his sinus issues. He has been using Nasonex Denies any fevers, n/v/d  Review of Systems  Constitutional: Negative.   HENT: Positive for congestion, postnasal drip, rhinorrhea, sinus pain, sinus pressure and voice change. Negative for ear discharge, ear pain, sore throat and trouble swallowing.   Respiratory: Negative.   Cardiovascular: Negative.   Neurological: Negative.   All other systems reviewed and are negative.  Past Medical History:  Diagnosis Date  . Allergy   . Arthritis    hands   . Chicken pox   . Colon polyps   . GERD (gastroesophageal reflux disease)   . Hyperlipidemia   . Sinus trouble   . Sleep apnea     Social History   Social History  . Marital status: Married    Spouse name: N/A  . Number of children: 1  . Years of education: 2419   Occupational History  . Minister    Social History Main Topics  . Smoking status: Never Smoker  . Smokeless tobacco: Never Used  . Alcohol use 0.0 oz/week     Comment: 2 drinks monthly  . Drug use: No  . Sexual activity: Not on file   Other Topics Concern  . Not on file   Social History Narrative   Born [place] raised in [place], [place]. Currently resides in a house. 3 dogs. Fun: Yardwork, golf, read   Denies any religious beliefs effecting health care.     Past Surgical History:  Procedure Laterality Date  . Cartlage removed     Right knee    Family  History  Problem Relation Age of Onset  . Diabetes Maternal Aunt   . Breast cancer Maternal Aunt   . Lung cancer Father   . Hyperlipidemia Father   . Peripheral Artery Disease Mother   . Macular degeneration Mother   . Arthritis Mother   . Hyperlipidemia Mother   . Stroke Mother   . Hypertension Mother   . Heart disease Maternal Grandfather   . Hyperlipidemia Maternal Grandfather   . Heart disease Paternal Grandfather   . Hyperlipidemia Paternal Grandfather   . Alcohol abuse Maternal Uncle     Allergies  Allergen Reactions  . Penicillins   . Yellow Jacket Venom [Bee Venom]     Current Outpatient Prescriptions on File Prior to Visit  Medication Sig Dispense Refill  . aspirin EC 81 MG tablet Take 1 tablet (81 mg total) by mouth daily. 90 tablet 3  . azelastine (ASTELIN) 0.1 % nasal spray Place 2 sprays into both nostrils 2 (two) times daily as needed for rhinitis. Use in each nostril as directed 30 mL 12  . cholecalciferol (VITAMIN D) 1000 UNITS tablet Take 1,000 Units by mouth daily.    . cyanocobalamin 1000 MCG tablet Take 1,000 mcg by mouth daily.    . cyclobenzaprine (  FLEXERIL) 10 MG tablet Take 1 tablet (10 mg total) by mouth at bedtime as needed for muscle spasms. 30 tablet 0  . EPINEPHrine (EPIPEN 2-PAK) 0.3 mg/0.3 mL IJ SOAJ injection Inject 0.3 mg into the muscle. Reported on 04/25/2016    . Fluticasone-Salmeterol (ADVAIR HFA IN) Inhale into the lungs as directed.    . methylphenidate (CONCERTA) 36 MG PO CR tablet Take 36 mg by mouth.    . mometasone (NASONEX) 50 MCG/ACT nasal spray Place 2 sprays into the nose daily. 17 g 5  . Multiple Vitamin (MULTIVITAMIN) tablet Take 1 tablet by mouth daily.    . ranitidine (ZANTAC) 150 MG tablet Take 150 mg by mouth daily as needed for heartburn.     . terbinafine (LAMISIL) 1 % cream Apply 1 application topically as directed.    . terbinafine (LAMISIL) 250 MG tablet Take 250 mg by mouth daily.    Marland Kitchen. atorvastatin (LIPITOR) 20 MG  tablet Take 1 tablet (20 mg total) by mouth daily. (Patient not taking: Reported on 10/23/2016) 90 tablet 0  . buPROPion (WELLBUTRIN XL) 300 MG 24 hr tablet Take 300 mg by mouth.     No current facility-administered medications on file prior to visit.     BP 110/78 (BP Location: Right Arm, Patient Position: Sitting, Cuff Size: Normal)   Pulse 87   Temp 98.1 F (36.7 C) (Oral)   Ht 5\' 11"  (1.803 m)   Wt 204 lb 6.4 oz (92.7 kg)   SpO2 96%   BMI 28.51 kg/m       Objective:   Physical Exam  Constitutional: He is oriented to person, place, and time. He appears well-developed and well-nourished. No distress.  HENT:  Right Ear: Hearing, tympanic membrane, external ear and ear canal normal.  Left Ear: Hearing, tympanic membrane, external ear and ear canal normal.  Nose: Mucosal edema and rhinorrhea present. Right sinus exhibits frontal sinus tenderness. Right sinus exhibits no maxillary sinus tenderness. Left sinus exhibits frontal sinus tenderness. Left sinus exhibits no maxillary sinus tenderness.  Eyes: Conjunctivae and EOM are normal. Pupils are equal, round, and reactive to light. Right eye exhibits no discharge. Left eye exhibits no discharge.  Neck: Normal range of motion. Neck supple.  Cardiovascular: Normal rate, regular rhythm, normal heart sounds and intact distal pulses.  Exam reveals no gallop and no friction rub.   No murmur heard. Pulmonary/Chest: Effort normal and breath sounds normal. No respiratory distress. He has no wheezes. He has no rales. He exhibits no tenderness.  Lymphadenopathy:       Head (right side): Submandibular adenopathy present.       Head (left side): Submandibular adenopathy present.  Neurological: He is alert and oriented to person, place, and time.  Skin: Skin is warm and dry. No rash noted. No erythema. No pallor.  Psychiatric: He has a normal mood and affect. His behavior is normal. Judgment and thought content normal.  Nursing note and vitals  reviewed.     Assessment & Plan:  1. Acute non-recurrent frontal sinusitis - Advised OTC Claritin D or Zyrtec D. Continue with nasal sprays. If no improvement in the next 2 days then can start antibiotics.  - doxycycline (VIBRAMYCIN) 100 MG capsule; Take 1 capsule (100 mg total) by mouth 2 (two) times daily.  Dispense: 14 capsule; Refill: 0  Shirline Freesory Belkis Norbeck, NP

## 2016-11-08 ENCOUNTER — Other Ambulatory Visit: Payer: Managed Care, Other (non HMO)

## 2016-12-19 ENCOUNTER — Other Ambulatory Visit (INDEPENDENT_AMBULATORY_CARE_PROVIDER_SITE_OTHER): Payer: Managed Care, Other (non HMO)

## 2016-12-19 ENCOUNTER — Ambulatory Visit (INDEPENDENT_AMBULATORY_CARE_PROVIDER_SITE_OTHER): Payer: Managed Care, Other (non HMO) | Admitting: Internal Medicine

## 2016-12-19 ENCOUNTER — Encounter: Payer: Self-pay | Admitting: Internal Medicine

## 2016-12-19 VITALS — BP 138/80 | HR 76 | Temp 97.9°F | Resp 16 | Ht 71.0 in | Wt 217.0 lb

## 2016-12-19 DIAGNOSIS — Z860100 Personal history of colon polyps, unspecified: Secondary | ICD-10-CM

## 2016-12-19 DIAGNOSIS — Z1159 Encounter for screening for other viral diseases: Secondary | ICD-10-CM

## 2016-12-19 DIAGNOSIS — K648 Other hemorrhoids: Secondary | ICD-10-CM

## 2016-12-19 DIAGNOSIS — E785 Hyperlipidemia, unspecified: Secondary | ICD-10-CM

## 2016-12-19 DIAGNOSIS — I70219 Atherosclerosis of native arteries of extremities with intermittent claudication, unspecified extremity: Secondary | ICD-10-CM | POA: Diagnosis not present

## 2016-12-19 DIAGNOSIS — K921 Melena: Secondary | ICD-10-CM | POA: Diagnosis not present

## 2016-12-19 LAB — CBC WITH DIFFERENTIAL/PLATELET
BASOS PCT: 0.9 % (ref 0.0–3.0)
Basophils Absolute: 0.1 10*3/uL (ref 0.0–0.1)
EOS ABS: 0.1 10*3/uL (ref 0.0–0.7)
Eosinophils Relative: 2.2 % (ref 0.0–5.0)
HCT: 45.5 % (ref 39.0–52.0)
HEMOGLOBIN: 15.3 g/dL (ref 13.0–17.0)
LYMPHS ABS: 1.9 10*3/uL (ref 0.7–4.0)
Lymphocytes Relative: 32.7 % (ref 12.0–46.0)
MCHC: 33.7 g/dL (ref 30.0–36.0)
MCV: 85.4 fl (ref 78.0–100.0)
MONO ABS: 0.6 10*3/uL (ref 0.1–1.0)
Monocytes Relative: 10.1 % (ref 3.0–12.0)
NEUTROS PCT: 54.1 % (ref 43.0–77.0)
Neutro Abs: 3.1 10*3/uL (ref 1.4–7.7)
Platelets: 254 10*3/uL (ref 150.0–400.0)
RBC: 5.32 Mil/uL (ref 4.22–5.81)
RDW: 12.9 % (ref 11.5–15.5)
WBC: 5.7 10*3/uL (ref 4.0–10.5)

## 2016-12-19 LAB — HEPATITIS C ANTIBODY: HCV Ab: NEGATIVE

## 2016-12-19 MED ORDER — ASPIRIN EC 81 MG PO TBEC: 81.0000 mg | DELAYED_RELEASE_TABLET | Freq: Every day | ORAL | 3 refills | Status: DC

## 2016-12-19 MED ORDER — HYDROCORTISONE ACE-PRAMOXINE 1-1 % RE FOAM: 1.0000 | Freq: Two times a day (BID) | RECTAL | 1 refills | Status: DC

## 2016-12-19 MED ORDER — ATORVASTATIN CALCIUM 20 MG PO TABS: 20.0000 mg | ORAL_TABLET | Freq: Every day | ORAL | 3 refills | Status: DC

## 2016-12-19 NOTE — Progress Notes (Signed)
Subjective:  Patient ID: William Bauer, male    DOB: [birth year]  Age: 61 y.o. MRN: [id number]  CC: Rectal Bleeding   HPI William Headlandnthony Dever presents for A several week history of intermittent, painless, blood in the toilet bowl and in his stool. He reports mild constipation but denies diarrhea, abdominal pain, or cramping. [facility] and he said there were some polyps identified.  He also complains of a vague discomfort in his hips, thighs, and calves associated with activity. It is consistent with claudication. He has chronic low back pain but his had no back pain recently and denies paresthesias in his lower extremities.  Outpatient Medications Prior to Visit  Medication Sig Dispense Refill  . cholecalciferol (VITAMIN D) 1000 UNITS tablet Take 1,000 Units by mouth daily.    . cyanocobalamin 1000 MCG tablet Take 1,000 mcg by mouth daily.    Marland Kitchen. EPINEPHrine (EPIPEN 2-PAK) 0.3 mg/0.3 mL IJ SOAJ injection Inject 0.3 mg into the muscle. Reported on 04/25/2016    . Fluticasone-Salmeterol (ADVAIR HFA IN) Inhale into the lungs as directed.    Marland Kitchen. aspirin EC 81 MG tablet Take 1 tablet (81 mg total) by mouth daily. 90 tablet 3  . ranitidine (ZANTAC) 150 MG tablet Take 150 mg by mouth daily as needed for heartburn.     Marland Kitchen. atorvastatin (LIPITOR) 20 MG tablet Take 1 tablet (20 mg total) by mouth daily. (Patient not taking: Reported on 10/23/2016) 90 tablet 0  . azelastine (ASTELIN) 0.1 % nasal spray Place 2 sprays into both nostrils 2 (two) times daily as needed for rhinitis. Use in each nostril as directed 30 mL 12  . buPROPion (WELLBUTRIN XL) 300 MG 24 hr tablet Take 300 mg by mouth.    . cyclobenzaprine (FLEXERIL) 10 MG tablet Take 1 tablet (10 mg total) by mouth at bedtime as needed for muscle spasms. 30 tablet 0  . doxycycline (VIBRAMYCIN) 100 MG capsule Take 1 capsule (100 mg total) by mouth 2 (two) times daily. 14 capsule 0  . methylphenidate (CONCERTA)  36 MG PO CR tablet Take 36 mg by mouth.    . mometasone (NASONEX) 50 MCG/ACT nasal spray Place 2 sprays into the nose daily. 17 g 5  . Multiple Vitamin (MULTIVITAMIN) tablet Take 1 tablet by mouth daily.    Marland Kitchen. terbinafine (LAMISIL) 1 % cream Apply 1 application topically as directed.    . terbinafine (LAMISIL) 250 MG tablet Take 250 mg by mouth daily.     No facility-administered medications prior to visit.     ROS Review of Systems  Constitutional: Negative.  Negative for activity change, chills, diaphoresis, fatigue and unexpected weight change.  HENT: Negative.  Negative for trouble swallowing.   Eyes: Negative for visual disturbance.  Respiratory: Negative for cough, chest tightness, shortness of breath, wheezing and stridor.   Cardiovascular: Negative for chest pain, palpitations and leg swelling.  Gastrointestinal: Positive for anal bleeding, blood in stool and constipation. Negative for abdominal distention, abdominal pain, diarrhea, nausea and vomiting.  Endocrine: Negative.   Genitourinary: Negative.  Negative for difficulty urinating.  Musculoskeletal: Positive for back pain. Negative for joint swelling, myalgias and neck pain.  Skin: Negative.   Allergic/Immunologic: Negative.   Neurological: Negative.  Negative for dizziness, weakness, light-headedness and numbness.  Hematological: Negative.  Negative for adenopathy. Does not bruise/bleed easily.  Psychiatric/Behavioral: Negative.     Objective:  BP 138/80 (BP Location: Left Arm, Patient Position: Sitting, Cuff Size:  Large)   Pulse 76   Temp 97.9 F (36.6 C) (Oral)   Ht 5\' 11"  (1.803 m)   Wt 217 lb (98.4 kg)   SpO2 95%   BMI 30.27 kg/m   BP Readings from Last 3 Encounters:  12/19/16 138/80  10/23/16 110/78  08/21/16 132/82    Wt Readings from Last 3 Encounters:  12/19/16 217 lb (98.4 kg)  10/23/16 204 lb 6.4 oz (92.7 kg)  08/21/16 204 lb (92.5 kg)    Physical Exam  Constitutional: He is oriented to  person, place, and time. No distress.  HENT:  Mouth/Throat: Oropharynx is clear and moist. No oropharyngeal exudate.  Eyes: Conjunctivae are normal. Right eye exhibits no discharge. Left eye exhibits no discharge. No scleral icterus.  Neck: Normal range of motion. Neck supple. No JVD present. No tracheal deviation present. No thyromegaly present.  Cardiovascular: Normal rate, regular rhythm, normal heart sounds and intact distal pulses.  Exam reveals no gallop and no friction rub.   No murmur heard. Pulses:      Femoral pulses are 1+ on the right side, and 1+ on the left side.      Popliteal pulses are 1+ on the right side, and 1+ on the left side.       Dorsalis pedis pulses are 1+ on the right side, and 1+ on the left side.       Posterior tibial pulses are 0 on the right side, and 0 on the left side.  He has diminished pulses in both feet  Pulmonary/Chest: Effort normal and breath sounds normal. No stridor. No respiratory distress. He has no wheezes. He has no rales. He exhibits no tenderness.  Abdominal: Soft. Bowel sounds are normal. He exhibits no distension and no mass. There is no tenderness. There is no rebound and no guarding.  Genitourinary: Prostate normal. Rectal exam shows internal hemorrhoid. Rectal exam shows no external hemorrhoid, no fissure, no mass, no tenderness, anal tone normal and guaiac negative stool. Prostate is not enlarged and not tender.  Musculoskeletal: Normal range of motion. He exhibits no edema, tenderness or deformity.  Lymphadenopathy:    He has no cervical adenopathy.  Neurological: He is oriented to person, place, and time.  Skin: Skin is warm and dry. No rash noted. He is not diaphoretic. No erythema. No pallor.  Vitals reviewed.   Lab Results  Component Value Date   WBC 5.7 12/19/2016   HGB 15.3 12/19/2016   HCT 45.5 12/19/2016   PLT 254.0 12/19/2016   GLUCOSE 69 08/17/2016   CHOL 213 (H) 08/17/2016   TRIG 159 (H) 08/17/2016   HDL 47  08/17/2016   LDLCALC 134 (H) 08/17/2016   NA 139 08/17/2016   K 4.1 08/17/2016   CL 103 08/17/2016   CREATININE 0.99 08/17/2016   BUN 14 08/17/2016   CO2 24 08/17/2016   TSH 1.60 03/08/2016   PSA 0.42 05/31/2015    Ct Cardiac Scoring  Addendum Date: 08/28/2016   ADDENDUM REPORT: 08/28/2016 17:36 CLINICAL DATA:  Risk stratification EXAM: Coronary Calcium Score TECHNIQUE: The patient was scanned on a Siemens Somatom 64 slice scanner. Axial non-contrast 3 mm slices were carried out through the heart. The data set was analyzed on a dedicated work station and scored using the Agatson method. FINDINGS: Non-cardiac: See separate report from [facility]. Ascending Aorta:  Normal size.  No calcifications. Pericardium: Normal. Coronary arteries:  Normal origin. IMPRESSION: Coronary calcium score of 132. This was 80 percentile for age and  sex matched control. Tobias Alexander Electronically Signed   By: Tobias Alexander   On: 08/28/2016 17:36   Result Date: 08/28/2016 EXAM: OVER-READ INTERPRETATION  CT CHEST The following report is an over-read performed by radiologist Dr. Royal Piedra [facility] on 08/28/2016. This over-read does not include interpretation of cardiac or coronary anatomy or pathology. The coronary calcium score/coronary CTA interpretation by the cardiologist is attached. COMPARISON:  None. FINDINGS: Within the visualized portions of the thorax there are no suspicious appearing pulmonary nodules or masses, there is no acute consolidative airspace disease, no pleural effusions, no pneumothorax and no lymphadenopathy. Visualized portions of the upper abdomen are unremarkable. There are no aggressive appearing lytic or blastic lesions noted in the visualized portions of the skeleton. IMPRESSION: 1. No significant incidental noncardiac findings noted. Electronically Signed: By: Trudie Reed M.D. On: 08/28/2016 16:10    Assessment & Plan:   Darol was seen today  for rectal bleeding.  Diagnoses and all orders for this visit:  Need for hepatitis C screening test -     Hepatitis C antibody; Future  Hx of colonic polyps- I think he should undergo follow-up colonoscopy to screen for recurrent polyps. -     CBC with Differential/Platelet; Future -     Ambulatory referral to Gastroenterology  Blood in stool, frank- this is most consistent with internal anal hemorrhoidal bleeding. 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 In the meantime will start statin and aspirin therapy to treat this. -     atorvastatin (LIPITOR) 20 MG tablet; Take 1 tablet (20 mg total) by mouth daily. -     aspirin EC 81 MG tablet; Take 1 tablet (81 mg total) by mouth daily. -     VAS [place] ABI WITH/WO TBI; Future  Hyperlipidemia, unspecified hyperlipidemia type -     atorvastatin (LIPITOR) 20 MG tablet; Take 1 tablet (20 mg total) by mouth daily.  Hemorrhoids, internal, with bleeding -     hydrocortisone-pramoxine (PROCTOFOAM-HC) rectal foam; Place 1 applicator rectally 2 (two) times daily.   I have discontinued Mr. Pruiett's ranitidine, cyclobenzaprine, buPROPion, methylphenidate, mometasone, terbinafine, terbinafine, multivitamin, azelastine, and doxycycline. I am also having him start on hydrocortisone-pramoxine. Additionally, I am having him maintain his cholecalciferol, EPINEPHrine, Fluticasone-Salmeterol (ADVAIR HFA IN), cyanocobalamin, omeprazole, Omega-3 Fatty Acids (FISH OIL OMEGA-3 PO), atorvastatin, and aspirin EC.  Meds ordered this encounter  Medications  . omeprazole (PRILOSEC) 20 MG capsule    Sig: Take 20 mg by mouth.  . Omega-3 Fatty Acids (FISH OIL OMEGA-3 PO)    Sig: Take by mouth.  Marland Kitchen atorvastatin (LIPITOR) 20 MG tablet    Sig: Take 1 tablet (20 mg total) by mouth daily.    Dispense:  90 tablet    Refill:  3  . aspirin EC 81 MG  tablet    Sig: Take 1 tablet (81 mg total) by mouth daily.    Dispense:  90 tablet    Refill:  3  . hydrocortisone-pramoxine (PROCTOFOAM-HC) rectal foam    Sig: Place 1 applicator rectally 2 (two) times daily.    Dispense:  10 g    Refill:  1     Follow-up: Return if symptoms worsen or fail to improve.  Sanda Linger, MD

## 2016-12-19 NOTE — Patient Instructions (Signed)
Hemorrhoids Hemorrhoids are swollen veins in and around the rectum or anus. There are two types of hemorrhoids:  Internal hemorrhoids. These occur in the veins that are just inside the rectum. They may poke through to the outside and become irritated and painful.  External hemorrhoids. These occur in the veins that are outside of the anus and can be felt as a painful swelling or hard lump near the anus.  Most hemorrhoids do not cause serious problems, and they can be managed with home treatments such as diet and lifestyle changes. If home treatments do not help your symptoms, procedures can be done to shrink or remove the hemorrhoids. What are the causes? This condition is caused by increased pressure in the anal area. This pressure may result from various things, including:  Constipation.  Straining to have a bowel movement.  Diarrhea.  Pregnancy.  Obesity.  Sitting for long periods of time.  Heavy lifting or other activity that causes you to strain.  Anal sex.  What are the signs or symptoms? Symptoms of this condition include:  Pain.  Anal itching or irritation.  Rectal bleeding.  Leakage of stool (feces).  Anal swelling.  One or more lumps around the anus.  How is this diagnosed? This condition can often be diagnosed through a visual exam. Other exams or tests may also be done, such as:  Examination of the rectal area with a gloved hand (digital rectal exam).  Examination of the anal canal using a small tube (anoscope).  A blood test, if you have lost a significant amount of blood.  A test to look inside the colon (sigmoidoscopy or colonoscopy).  How is this treated? This condition can usually be treated at home. However, various procedures may be done if dietary changes, lifestyle changes, and other home treatments do not help your symptoms. These procedures can help make the hemorrhoids smaller or remove them completely. Some of these procedures involve  surgery, and others do not. Common procedures include:  Rubber band ligation. Rubber bands are placed at the base of the hemorrhoids to cut off the blood supply to them.  Sclerotherapy. Medicine is injected into the hemorrhoids to shrink them.  Infrared coagulation. A type of light energy is used to get rid of the hemorrhoids.  Hemorrhoidectomy surgery. The hemorrhoids are surgically removed, and the veins that supply them are tied off.  Stapled hemorrhoidopexy surgery. A circular stapling device is used to remove the hemorrhoids and use staples to cut off the blood supply to them.  Follow these instructions at home: Eating and drinking  Eat foods that have a lot of fiber in them, such as whole grains, beans, nuts, fruits, and vegetables. Ask your health care provider about taking products that have added fiber (fiber supplements).  Drink enough fluid to keep your urine clear or pale yellow. Managing pain and swelling  Take warm sitz baths for 20 minutes, 3-4 times a day to ease pain and discomfort.  If directed, apply ice to the affected area. Using ice packs between sitz baths may be helpful. ? Put ice in a plastic bag. ? Place a towel between your skin and the bag. ? Leave the ice on for 20 minutes, 2-3 times a day. General instructions  Take over-the-counter and prescription medicines only as told by your health care provider.  Use medicated creams or suppositories as told.  Exercise regularly.  Go to the bathroom when you have the urge to have a bowel movement. Do not wait.    Avoid straining to have bowel movements.  Keep the anal area dry and clean. Use wet toilet paper or moist towelettes after a bowel movement.  Do not sit on the toilet for long periods of time. This increases blood pooling and pain. Contact a health care provider if:  You have increasing pain and swelling that are not controlled by treatment or medicine.  You have uncontrolled bleeding.  You  have difficulty having a bowel movement, or you are unable to have a bowel movement.  You have pain or inflammation outside the area of the hemorrhoids. This information is not intended to replace advice given to you by your health care provider. Make sure you discuss any questions you have with your health care provider. Document Released: 10/19/2000 Document Revised: 03/21/2016 Document Reviewed: 07/06/2015 Elsevier Interactive Patient Education  2017 Elsevier Inc.  

## 2016-12-19 NOTE — Progress Notes (Signed)
Pre visit review using our clinic review tool, if applicable. No additional management support is needed unless otherwise documented below in the visit note. 

## 2016-12-20 ENCOUNTER — Encounter: Payer: Self-pay | Admitting: Internal Medicine

## 2016-12-24 ENCOUNTER — Ambulatory Visit (HOSPITAL_COMMUNITY)
Admission: RE | Admit: 2016-12-24 | Discharge: 2016-12-24 | Disposition: A | Discharge status: Home / Self Care | Payer: Managed Care, Other (non HMO) | Source: Ambulatory Visit | Attending: Vascular Surgery | Admitting: Vascular Surgery

## 2016-12-24 DIAGNOSIS — I70219 Atherosclerosis of native arteries of extremities with intermittent claudication, unspecified extremity: Secondary | ICD-10-CM | POA: Diagnosis present

## 2016-12-25 ENCOUNTER — Other Ambulatory Visit: Payer: Self-pay | Admitting: Internal Medicine

## 2016-12-25 ENCOUNTER — Telehealth: Payer: Self-pay | Admitting: *Deleted

## 2016-12-25 ENCOUNTER — Encounter (HOSPITAL_COMMUNITY): Payer: Managed Care, Other (non HMO)

## 2016-12-25 DIAGNOSIS — K648 Other hemorrhoids: Secondary | ICD-10-CM

## 2016-12-25 MED ORDER — HYDROCORTISONE 2.5 % RE CREA: 1.0000 "application " | TOPICAL_CREAM | Freq: Two times a day (BID) | RECTAL | 2 refills | Status: DC

## 2016-12-25 NOTE — Telephone Encounter (Signed)
changed

## 2016-12-25 NOTE — Telephone Encounter (Signed)
MD sent to [place]...Raechel Chute/lmb

## 2016-12-25 NOTE — Telephone Encounter (Signed)
Pharmacist called left msg on triage stating pt insurance plan does not cover the Proctofoam, but will cover the Proctomed-Hydrocortisone 2.5% cream. Requesting MD to change...Raechel Chute/lmb

## 2016-12-26 ENCOUNTER — Telehealth: Payer: Self-pay

## 2016-12-26 NOTE — Telephone Encounter (Signed)
Received fax: Results for the Ankle Brachial Indices are available.

## 2016-12-26 NOTE — Telephone Encounter (Signed)
Let him know that the blood flow in his legs is normal  The symptoms are probably coming from his low back

## 2016-12-26 NOTE — Telephone Encounter (Signed)
LVM for pt to call back as soon as possible.   

## 2016-12-31 ENCOUNTER — Other Ambulatory Visit: Payer: Self-pay | Admitting: Emergency Medicine

## 2016-12-31 NOTE — Telephone Encounter (Signed)
LVM for pt to call back as soon as possible.   

## 2017-01-11 NOTE — Telephone Encounter (Signed)
Patient never called back. Closing phone note.

## 2017-02-14 ENCOUNTER — Encounter: Payer: Self-pay | Admitting: Internal Medicine

## 2017-03-06 ENCOUNTER — Encounter: Payer: Self-pay | Admitting: Internal Medicine

## 2017-03-13 ENCOUNTER — Encounter: Payer: Self-pay | Admitting: Internal Medicine

## 2017-03-13 ENCOUNTER — Ambulatory Visit (INDEPENDENT_AMBULATORY_CARE_PROVIDER_SITE_OTHER): Payer: Managed Care, Other (non HMO) | Admitting: Internal Medicine

## 2017-03-13 VITALS — BP 124/86 | HR 82 | Temp 98.9°F | Resp 16 | Wt 209.0 lb

## 2017-03-13 DIAGNOSIS — J0100 Acute maxillary sinusitis, unspecified: Secondary | ICD-10-CM | POA: Insufficient documentation

## 2017-03-13 MED ORDER — DOXYCYCLINE HYCLATE 100 MG PO TABS: 100.0000 mg | ORAL_TABLET | Freq: Two times a day (BID) | ORAL | 0 refills | Status: DC

## 2017-03-13 NOTE — Patient Instructions (Addendum)
An antibiotic prescription was given - take only if your symptoms do not improve after several days.  If your symptoms worsen or fail to improve, please contact our office for further instruction, or in case of emergency go directly to the emergency room at the closest medical facility.   General Recommendations:    Please drink plenty of fluids.  Get plenty of rest   Sleep in humidified air  Use saline nasal sprays  Netti pot  OTC Medications:  Decongestants - helps relieve congestion   Flonase (generic fluticasone) or Nasacort (generic triamcinolone) - please make sure to use the "cross-over" technique at a 45 degree angle towards the opposite eye as opposed to straight up the nasal passageway.   Sudafed (generic pseudoephedrine - Note this is the one that is available behind the pharmacy counter); Products with phenylephrine (-PE) may also be used but is often not as effective as pseudoephedrine.   If you have HIGH BLOOD PRESSURE - Coricidin HBP; AVOID any product that is -D as this contains pseudoephedrine which may increase your blood pressure.  Afrin (oxymetazoline) every 6-8 hours for up to 3 days.  Allergies - helps relieve runny nose, itchy eyes and sneezing   Claritin (generic loratidine), Allegra (fexofenidine), or Zyrtec (generic cyrterizine) for runny nose. These medications should not cause drowsiness.  Note - Benadryl (generic diphenhydramine) may be used however may cause drowsiness  Cough -   Delsym or Robitussin (generic dextromethorphan)  Expectorants - helps loosen mucus to ease removal   Mucinex (generic guaifenesin) as directed on the package.  Headaches / General Aches   Tylenol (generic acetaminophen) - DO NOT EXCEED 3 grams (3,000 mg) in a 24 hour time period  Advil/Motrin (generic ibuprofen)  Sore Throat -   Salt water gargle   Chloraseptic (generic benzocaine) spray or lozenges / Sucrets (generic dyclonine)

## 2017-03-13 NOTE — Progress Notes (Signed)
Subjective:    Patient ID: William Headlandnthony Moro, male    DOB: 03-Aug-1956, 61 y.o.   MRN: [id number]  HPI He is here for an acute visit for cold symptoms.  His symptoms started two days ago  He is experiencing sinus / nasal congestion, sinus pressure, PND sore throat - especially in his palate, and he had a fever of 100.6.  He feels achy, has decreased appetite, headaches, lightheadedness.     He has tried taking an anti-histamine and cleaned his sinuses out with a neti pot.   He does allergy shots.  He used to get regular sinus infections, but has not gotten them for a couple of years.  This one feels different.   Medications and allergies reviewed with patient and updated if appropriate.  Patient Active Problem List   Diagnosis Date Noted  . Hx of colonic polyps 12/19/2016  . Blood in stool, frank 12/19/2016  . 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 Hemorrhoids, internal, with bleeding 12/19/2016  . Chronic cough 05/31/2016  . Candida infection of genital region 04/05/2016  . Dysphagia 04/05/2016  . Routine general medical examination at a health care facility 01/07/2015  . Hyperlipidemia 12/24/2014  . Seasonal and perennial allergic rhinitis 11/23/2014  . Food allergy 11/23/2014  . 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 GERD (gastroesophageal reflux disease) 11/23/2014  . Obstructive sleep apnea 11/23/2014    Current Outpatient Prescriptions on File Prior to Visit  Medication Sig Dispense Refill  . aspirin EC 81 MG tablet Take 1 tablet (81 mg total) by mouth daily. 90 tablet 3  . atorvastatin (LIPITOR) 20 MG tablet Take 1 tablet (20 mg total) by mouth daily. 90 tablet 3  . cholecalciferol (VITAMIN D) 1000 UNITS tablet Take 1,000 Units by mouth daily.    . cyanocobalamin 1000 MCG tablet Take 1,000 mcg by mouth daily.    Marland Kitchen. EPINEPHrine (EPIPEN 2-PAK) 0.3 mg/0.3 mL IJ SOAJ injection Inject 0.3 mg into the muscle. Reported on 04/25/2016    .  Fluticasone-Salmeterol (ADVAIR HFA IN) Inhale into the lungs as directed.    . hydrocortisone (PROCTO-MED HC) 2.5 % rectal cream Place 1 application rectally 2 (two) times daily. 30 g 2  . Omega-3 Fatty Acids (FISH OIL OMEGA-3 PO) Take by mouth.    Marland Kitchen. omeprazole (PRILOSEC) 20 MG capsule Take 20 mg by mouth.    Marland Kitchen. omeprazole (PRILOSEC) 40 MG capsule TAKE 1 CAPSULE EVERY DAY. 30 capsule 2   No current facility-administered medications on file prior to visit.     Past Medical History:  Diagnosis Date  . Allergy   . Arthritis    hands   . Chicken pox   . Colon polyps   . GERD (gastroesophageal reflux disease)   . Hyperlipidemia   . Sinus trouble   . Sleep apnea     Past Surgical History:  Procedure Laterality Date  . Cartlage removed     Right knee    Social History   Social History  . Marital status: Married    Spouse name: N/A  . Number of children: 1  . Years of education: 6719   Occupational History  . Minister    Social History Main Topics  . Smoking status: Never Smoker  . Smokeless tobacco: Never Used  . Alcohol use 0.0 oz/week     Comment: 2 drinks monthly  . Drug use: No  . Sexual activity: Not on file   Other Topics Concern  .  Not on file   Social History Narrative   Born and raised in [place], [place]. Currently resides in a house. 3 dogs. Fun: Yardwork, golf, read   Denies any religious beliefs effecting health care.     Family History  Problem Relation Age of Onset  . Diabetes Maternal Aunt   . Breast cancer Maternal Aunt   . Lung cancer Father   . Hyperlipidemia Father   . Peripheral Artery Disease Mother   . Macular degeneration Mother   . Arthritis Mother   . Hyperlipidemia Mother   . Stroke Mother   . Hypertension Mother   . Heart disease Maternal Grandfather   . Hyperlipidemia Maternal Grandfather   . Heart disease Paternal Grandfather   . Hyperlipidemia Paternal Grandfather   . Alcohol abuse Maternal Uncle     Review of Systems    Constitutional: Positive for appetite change (dec) and fever.  HENT: Positive for congestion, postnasal drip, rhinorrhea, sinus pressure and sore throat. Negative for ear pain and sinus pain.   Respiratory: Negative for cough, shortness of breath and wheezing.   Gastrointestinal: Negative for diarrhea and nausea.  Musculoskeletal: Positive for myalgias.  Neurological: Positive for light-headedness and headaches. Negative for dizziness.       Objective:   Vitals:   03/13/17 1104  BP: 124/86  Pulse: 82  Resp: 16  Temp: 98.9 F (37.2 C)   Filed Weights   03/13/17 1104  Weight: 209 lb (94.8 kg)   Body mass index is 29.15 kg/m.  Wt Readings from Last 3 Encounters:  03/13/17 209 lb (94.8 kg)  12/19/16 217 lb (98.4 kg)  10/23/16 204 lb 6.4 oz (92.7 kg)     Physical Exam GENERAL APPEARANCE: Appears stated age, well appearing, NAD EYES: conjunctiva clear, no icterus HEENT: bilateral tympanic membranes and ear canals normal, oropharynx with moderate erythema, nasal congestion, minimal maxillary sinus tenderness on exam,  no thyromegaly, trachea midline, no cervical or supraclavicular lymphadenopathy LUNGS: Clear to auscultation without wheeze or crackles, unlabored breathing, good air entry bilaterally HEART: Normal S1,S2 without murmurs EXTREMITIES: Without clubbing, cyanosis, or edema         Assessment & Plan:   See Problem List for Assessment and Plan of chronic medical problems.

## 2017-03-13 NOTE — Assessment & Plan Note (Signed)
Likely viral in nature Discussed symptomatic treatment, rest, fluids I will give a prescription for an antibiotic to use only if his symptoms do not improve over the next several days He will call with any questions/concerns

## 2017-04-24 ENCOUNTER — Other Ambulatory Visit: Payer: Self-pay | Admitting: Emergency Medicine

## 2017-05-17 ENCOUNTER — Telehealth: Payer: Self-pay | Admitting: *Deleted

## 2017-05-17 ENCOUNTER — Ambulatory Visit (INDEPENDENT_AMBULATORY_CARE_PROVIDER_SITE_OTHER): Payer: Managed Care, Other (non HMO) | Admitting: *Deleted

## 2017-05-17 DIAGNOSIS — Z23 Encounter for immunization: Secondary | ICD-10-CM | POA: Diagnosis not present

## 2017-05-17 MED ORDER — EPINEPHRINE 0.3 MG/0.3ML IJ SOAJ: 0.3000 mg | Freq: Once | INTRAMUSCULAR | 0 refills | Status: AC

## 2017-05-17 NOTE — Telephone Encounter (Signed)
Pt is here for injection, and he is asking if MD can provide a letter stating that it's recommended that he received massages to help with his low back pian. He states he has a health care spending card, and for him to be able to use it for the massges they need note from his provider. Inform pt Dr. Yetta BarreJones is out of the office today, but will give him a call regarding letter once MD ok/generate letter...Raechel Chute/lmb

## 2017-05-17 NOTE — Patient Instructions (Signed)
Pt came in to get his Hep A/B going [place] and needing immunization.  Inform pt injection is 3 series, and will have to return back in 30 days, and then 6 months from today. Also pt want to get refill on the Epi pen.  [facility]...Raechel Chute/lmb

## 2017-05-20 ENCOUNTER — Encounter: Payer: Self-pay | Admitting: *Deleted

## 2017-05-20 NOTE — Telephone Encounter (Signed)
Generated letter called pt no answer LMOM letter ready for pick-up.../lmb 

## 2017-05-20 NOTE — Telephone Encounter (Signed)
Yes, I am okay with this 

## 2017-06-17 ENCOUNTER — Ambulatory Visit: Payer: Managed Care, Other (non HMO)

## 2017-06-18 ENCOUNTER — Ambulatory Visit (INDEPENDENT_AMBULATORY_CARE_PROVIDER_SITE_OTHER): Payer: Managed Care, Other (non HMO) | Admitting: General Practice

## 2017-06-18 DIAGNOSIS — Z23 Encounter for immunization: Secondary | ICD-10-CM | POA: Diagnosis not present

## 2017-09-23 ENCOUNTER — Ambulatory Visit (INDEPENDENT_AMBULATORY_CARE_PROVIDER_SITE_OTHER): Payer: 59

## 2017-09-23 ENCOUNTER — Telehealth: Payer: Self-pay

## 2017-09-23 DIAGNOSIS — Z23 Encounter for immunization: Secondary | ICD-10-CM | POA: Diagnosis not present

## 2017-09-23 NOTE — Telephone Encounter (Signed)
Patient's neurologist, Dr. Royston SinnerJoseph Miller in [place] is retiring, and patient is wondering if you would be able to prescribe methylphenidate 10mg  2x daily that dr Hyacinth Meekermiller was prescribing for him, patient will be willing to make appt to see you if he needs to---routing to dr Yetta Barrejones, please advise, I will call patient back, thanks

## 2017-09-30 NOTE — Telephone Encounter (Signed)
Yes, this is ok with me 

## 2017-10-01 NOTE — Telephone Encounter (Signed)
Tried to call patient, mailbox is full, no way to leave message, I have sent mychart message with dr Yetta Barrejones response

## 2017-10-09 NOTE — Telephone Encounter (Signed)
Patient states he is wanting to get a refill on methylphenidate  10mg  2x a day. I do not see it on his med chart. Please contact pt when this is refilled or if it can not be.

## 2017-10-10 ENCOUNTER — Other Ambulatory Visit: Payer: Self-pay | Admitting: Internal Medicine

## 2017-10-10 DIAGNOSIS — F900 Attention-deficit hyperactivity disorder, predominantly inattentive type: Secondary | ICD-10-CM | POA: Insufficient documentation

## 2017-10-10 MED ORDER — METHYLPHENIDATE HCL 10 MG PO TABS: 10.0000 mg | ORAL_TABLET | Freq: Two times a day (BID) | ORAL | 0 refills | Status: DC

## 2017-10-10 NOTE — Telephone Encounter (Signed)
Patient came by today and is requesting refill on above medication---per dr William Bauer, he will send in today--patient advised, routing to dr William Bauer

## 2017-10-23 ENCOUNTER — Encounter: Payer: 59 | Admitting: Internal Medicine

## 2017-10-24 ENCOUNTER — Encounter: Payer: Self-pay | Admitting: Internal Medicine

## 2017-10-24 ENCOUNTER — Ambulatory Visit (INDEPENDENT_AMBULATORY_CARE_PROVIDER_SITE_OTHER): Payer: 59 | Admitting: Internal Medicine

## 2017-10-24 ENCOUNTER — Other Ambulatory Visit (INDEPENDENT_AMBULATORY_CARE_PROVIDER_SITE_OTHER): Payer: 59

## 2017-10-24 VITALS — BP 130/90 | HR 76 | Temp 98.6°F | Resp 16 | Ht 71.0 in | Wt 209.0 lb

## 2017-10-24 DIAGNOSIS — Z86010 Personal history of colonic polyps: Secondary | ICD-10-CM

## 2017-10-24 DIAGNOSIS — E7800 Pure hypercholesterolemia, unspecified: Secondary | ICD-10-CM

## 2017-10-24 DIAGNOSIS — Z0000 Encounter for general adult medical examination without abnormal findings: Secondary | ICD-10-CM

## 2017-10-24 DIAGNOSIS — M5440 Lumbago with sciatica, unspecified side: Secondary | ICD-10-CM

## 2017-10-24 LAB — COMPREHENSIVE METABOLIC PANEL
ALBUMIN: 4.4 g/dL (ref 3.5–5.2)
ALK PHOS: 64 U/L (ref 39–117)
ALT: 23 U/L (ref 0–53)
AST: 19 U/L (ref 0–37)
BUN: 13 mg/dL (ref 6–23)
CALCIUM: 9.5 mg/dL (ref 8.4–10.5)
CHLORIDE: 102 meq/L (ref 96–112)
CO2: 30 mEq/L (ref 19–32)
Creatinine, Ser: 1.03 mg/dL (ref 0.40–1.50)
GFR: 78.41 mL/min (ref >60.00)
Glucose, Bld: 78 mg/dL (ref 70–99)
POTASSIUM: 3.8 meq/L (ref 3.5–5.1)
Sodium: 138 mEq/L (ref 135–145)
TOTAL PROTEIN: 7 g/dL (ref 6.0–8.3)
Total Bilirubin: 1 mg/dL (ref 0.2–1.2)

## 2017-10-24 LAB — LIPID PANEL
CHOLESTEROL: 205 mg/dL — AB (ref 0–200)
HDL: 50.8 mg/dL (ref >39.00)
LDL Cholesterol: 121 mg/dL — ABNORMAL HIGH (ref 0–99)
NonHDL: 154.47
Total CHOL/HDL Ratio: 4
Triglycerides: 165 mg/dL — ABNORMAL HIGH (ref 0.0–149.0)
VLDL: 33 mg/dL (ref 0.0–40.0)

## 2017-10-24 LAB — PSA: PSA: 0.64 ng/mL (ref 0.10–4.00)

## 2017-10-24 LAB — CBC WITH DIFFERENTIAL/PLATELET
BASOS PCT: 0.4 % (ref 0.0–3.0)
Basophils Absolute: 0 10*3/uL (ref 0.0–0.1)
EOS PCT: 1.3 % (ref 0.0–5.0)
Eosinophils Absolute: 0.1 10*3/uL (ref 0.0–0.7)
HEMATOCRIT: 49.6 % (ref 39.0–52.0)
HEMOGLOBIN: 16.4 g/dL (ref 13.0–17.0)
LYMPHS PCT: 26.9 % (ref 12.0–46.0)
Lymphs Abs: 2 10*3/uL (ref 0.7–4.0)
MCHC: 33 g/dL (ref 30.0–36.0)
MCV: 86.9 fl (ref 78.0–100.0)
MONO ABS: 0.6 10*3/uL (ref 0.1–1.0)
MONOS PCT: 8.2 % (ref 3.0–12.0)
Neutro Abs: 4.7 10*3/uL (ref 1.4–7.7)
Neutrophils Relative %: 63.2 % (ref 43.0–77.0)
Platelets: 285 10*3/uL (ref 150.0–400.0)
RBC: 5.71 Mil/uL (ref 4.22–5.81)
RDW: 13.4 % (ref 11.5–15.5)
WBC: 7.5 10*3/uL (ref 4.0–10.5)

## 2017-10-24 MED ORDER — CYCLOBENZAPRINE HCL 10 MG PO TABS: 10.0000 mg | ORAL_TABLET | Freq: Every day | ORAL | 2 refills | Status: DC

## 2017-10-24 NOTE — Progress Notes (Signed)
Subjective:  Patient ID: William Bauer, male    DOB: [birth year]  Age: [age] y.o. MRN: [id number]  CC: Annual Exam; Back Pain; Asthma; and Hyperlipidemia   HPI William Bauer presents for a CPX.  He complains of chronic, worsening low back pain that radiates into both lower extremities.  It is worse on the left than the right.  He has a vague sensation in his lower extremities of numbness/weakness/tingling.  He controls the pain with a combination of Flexeril, Motrin, and visits to see a chiropractor.  He had a traumatic back injury about 45 years ago.  He has a history of hypercholesterolemia but has decided not to take a statin.  He is very active, exercises on a bike and has had no recent episodes of CP, DOE, fatigue, palpitations, or diaphoresis.  Outpatient Medications Prior to Visit  Medication Sig Dispense Refill  . aspirin EC 81 MG tablet Take 1 tablet (81 mg total) by mouth daily. 90 tablet 3  . cholecalciferol (VITAMIN D) 1000 UNITS tablet Take 1,000 Units by mouth daily.    . Fluticasone-Salmeterol (ADVAIR HFA IN) Inhale into the lungs as directed.    . methylphenidate (RITALIN) 10 MG tablet Take 1 tablet (10 mg total) by mouth 2 (two) times daily. 60 tablet 0  . Omega-3 Fatty Acids (FISH OIL OMEGA-3 PO) Take by mouth.    Marland Kitchen. omeprazole (PRILOSEC) 20 MG capsule Take 20 mg by mouth.    Marland Kitchen. omeprazole (PRILOSEC) 40 MG capsule TAKE 1 CAPSULE EVERY DAY. 30 capsule 0  . atorvastatin (LIPITOR) 20 MG tablet Take 1 tablet (20 mg total) by mouth daily. 90 tablet 3  . cyanocobalamin 1000 MCG tablet Take 1,000 mcg by mouth daily.    Marland Kitchen. doxycycline (VIBRA-TABS) 100 MG tablet Take 1 tablet (100 mg total) by mouth 2 (two) times daily. 20 tablet 0  . hydrocortisone (PROCTO-MED HC) 2.5 % rectal cream Place 1 application rectally 2 (two) times daily. 30 g 2   No facility-administered medications prior to visit.     ROS Review of Systems  Constitutional: Negative for diaphoresis, fatigue and  unexpected weight change.  HENT: Negative.   Eyes: Negative.  Negative for visual disturbance.  Respiratory: Negative for cough, chest tightness and shortness of breath.   Cardiovascular: Negative.  Negative for chest pain, palpitations and leg swelling.  Gastrointestinal: Negative for abdominal pain, blood in stool, constipation, diarrhea, nausea and rectal pain.  Endocrine: Negative.   Genitourinary: Negative.  Negative for difficulty urinating, hematuria, scrotal swelling, testicular pain and urgency.  Musculoskeletal: Positive for back pain. Negative for myalgias and neck pain.  Skin: Negative for rash.  Allergic/Immunologic: Negative.   Neurological: Positive for weakness and numbness. Negative for dizziness, syncope, light-headedness and headaches.  Hematological: Negative for adenopathy. Does not bruise/bleed easily.  Psychiatric/Behavioral: Negative.     Objective:  BP 130/90 (BP Location: Left Arm, Patient Position: Sitting, Cuff Size: Normal)   Pulse 76   Temp 98.6 F (37 C) (Oral)   Resp 16   Ht 5\' 11"  (1.803 m)   Wt 209 lb (94.8 kg)   SpO2 98%   BMI 29.15 kg/m   BP Readings from Last 3 Encounters:  10/24/17 130/90  03/13/17 124/86  12/19/16 138/80    Wt Readings from Last 3 Encounters:  10/24/17 209 lb (94.8 kg)  03/13/17 209 lb (94.8 kg)  12/19/16 217 lb (98.4 kg)    Physical Exam  Constitutional: He is oriented to person, place, and time. No  distress.  HENT:  Mouth/Throat: Oropharynx is clear and moist. No oropharyngeal exudate.  Eyes: Conjunctivae are normal. Left eye exhibits no discharge. No scleral icterus.  Neck: Normal range of motion. Neck supple. No JVD present. No thyromegaly present.  Cardiovascular: Normal rate, regular rhythm and normal heart sounds.  No murmur heard. Pulmonary/Chest: Effort normal and breath sounds normal. No respiratory distress. He has no rales.  Abdominal: Soft. Bowel sounds are normal. He exhibits no distension and no  mass. There is no tenderness. There is no guarding.  Musculoskeletal: Normal range of motion. He exhibits no edema, tenderness or deformity.  Lymphadenopathy:    He has no cervical adenopathy.  Neurological: He is alert and oriented to person, place, and time. He has normal strength. He displays abnormal reflex. He displays no atrophy and no tremor. No cranial nerve deficit or sensory deficit. He exhibits normal muscle tone. He displays a negative Romberg sign. He displays no seizure activity. Coordination and gait normal.  Reflex Scores:      Tricep reflexes are 1+ on the right side and 1+ on the left side.      Bicep reflexes are 1+ on the right side and 1+ on the left side.      Brachioradialis reflexes are 1+ on the right side and 1+ on the left side.      Patellar reflexes are 1+ on the right side and 1+ on the left side.      Achilles reflexes are 1+ on the right side and 2+ on the left side. Neg SLR in BLE  Skin: Skin is warm and dry. No rash noted. He is not diaphoretic. No erythema. No pallor.  Vitals reviewed.   Lab Results  Component Value Date   WBC 7.5 10/24/2017   HGB 16.4 10/24/2017   HCT 49.6 10/24/2017   PLT 285.0 10/24/2017   GLUCOSE 78 10/24/2017   CHOL 205 (H) 10/24/2017   TRIG 165.0 (H) 10/24/2017   HDL 50.80 10/24/2017   LDLCALC 121 (H) 10/24/2017   ALT 23 10/24/2017   AST 19 10/24/2017   NA 138 10/24/2017   K 3.8 10/24/2017   CL 102 10/24/2017   CREATININE 1.03 10/24/2017   BUN 13 10/24/2017   CO2 30 10/24/2017   TSH 1.60 03/08/2016   PSA 0.64 10/24/2017    No results found.  Assessment & Plan:   William Brownsnthony was seen today for annual exam, back pain, asthma and hyperlipidemia.  Diagnoses and all orders for this visit:  Routine general medical examination at a health care facility-exam completed, labs reviewed, his ASCVD risk score is not significantly elevated so I do not recommend a statin, he is been referred for follow-up colonoscopy, vaccines  reviewed, patient education material was given. -     Lipid panel; Future -     PSA; Future -     HIV antibody; Future  Hx of colonic polyps -     Ambulatory referral to Gastroenterology  Low back pain with sciatica, sciatica laterality unspecified, unspecified back pain laterality, unspecified chronicity- He has worsening low back pain that radiates into his lower extremities with neurological symptoms.  He is hyperreflexic in the left Achilles.  I have asked him to undergo an MRI of his lumbar spine to identify the pathology and to direct treatment options with surgical intervention being a possible consideration. -     cyclobenzaprine (FLEXERIL) 10 MG tablet; Take 1 tablet (10 mg total) by mouth at bedtime. -  Comprehensive metabolic panel; Future -     CBC with Differential/Platelet; Future -     MR Lumbar Spine Wo Contrast; Future  Pure hypercholesterolemia- as above   I have discontinued William Browns Bauer cyanocobalamin, hydrocortisone, and doxycycline. I am also having him start on cyclobenzaprine. Additionally, I am having him maintain his cholecalciferol, Fluticasone-Salmeterol (ADVAIR HFA IN), omeprazole, Omega-3 Fatty Acids (FISH OIL OMEGA-3 PO), atorvastatin, aspirin EC, omeprazole, and methylphenidate.  Meds ordered this encounter  Medications  . cyclobenzaprine (FLEXERIL) 10 MG tablet    Sig: Take 1 tablet (10 mg total) by mouth at bedtime.    Dispense:  30 tablet    Refill:  2     Follow-up: Return in about 3 months (around 01/22/2018).  Sanda Linger, MD

## 2017-10-24 NOTE — Patient Instructions (Signed)

## 2017-10-25 LAB — HIV ANTIBODY (ROUTINE TESTING W REFLEX): HIV 1&2 Ab, 4th Generation: NONREACTIVE

## 2017-10-26 ENCOUNTER — Encounter: Payer: Self-pay | Admitting: Internal Medicine

## 2017-11-04 ENCOUNTER — Other Ambulatory Visit: Payer: 59

## 2017-11-18 ENCOUNTER — Ambulatory Visit: Payer: Managed Care, Other (non HMO)

## 2017-11-26 ENCOUNTER — Other Ambulatory Visit: Payer: Self-pay

## 2017-11-26 ENCOUNTER — Encounter (HOSPITAL_COMMUNITY): Payer: Self-pay | Admitting: Emergency Medicine

## 2017-11-26 ENCOUNTER — Ambulatory Visit (HOSPITAL_COMMUNITY)
Admission: EM | Admit: 2017-11-26 | Discharge: 2017-11-26 | Disposition: A | Discharge status: Home / Self Care | Payer: PRIVATE HEALTH INSURANCE | Attending: Family Medicine | Admitting: Family Medicine

## 2017-11-26 DIAGNOSIS — J111 Influenza due to unidentified influenza virus with other respiratory manifestations: Secondary | ICD-10-CM

## 2017-11-26 MED ORDER — ACETAMINOPHEN 325 MG PO TABS
ORAL_TABLET | ORAL | Status: AC
  Filled 2017-11-26: qty 2

## 2017-11-26 MED ORDER — OSELTAMIVIR PHOSPHATE 75 MG PO CAPS: 75.0000 mg | ORAL_CAPSULE | Freq: Two times a day (BID) | ORAL | 0 refills | Status: AC

## 2017-11-26 MED ORDER — ACETAMINOPHEN 325 MG PO TABS
650.0000 mg | ORAL_TABLET | Freq: Once | ORAL | Status: AC
  Administered 2017-11-26: 650 mg via ORAL

## 2017-11-26 MED ORDER — ONDANSETRON 8 MG PO TBDP: 8.0000 mg | ORAL_TABLET | Freq: Three times a day (TID) | ORAL | 0 refills | Status: DC | PRN

## 2017-11-26 NOTE — Discharge Instructions (Addendum)
Recommend start Tamiflu 75mg  twice a day as directed. Continue to push fluids. May take Tylenol 1000mg  every 8 hours as needed for fever. Continue OTC cold and cough medication as needed for symptoms. Rest. Follow-up in 3 days with your PCP if not improving.

## 2017-11-26 NOTE — ED Triage Notes (Signed)
Symptoms started yesterday.  Symptoms are fever ( 100.3 today), cough, extreme nasal congestion sore throat, body aches and fatigue

## 2017-11-27 ENCOUNTER — Encounter: Payer: Self-pay | Admitting: Internal Medicine

## 2017-11-27 ENCOUNTER — Ambulatory Visit: Payer: PRIVATE HEALTH INSURANCE | Admitting: Internal Medicine

## 2017-11-27 VITALS — BP 120/78 | HR 78 | Temp 97.8°F | Resp 16 | Ht 71.0 in | Wt 206.0 lb

## 2017-11-27 DIAGNOSIS — J0110 Acute frontal sinusitis, unspecified: Secondary | ICD-10-CM

## 2017-11-27 MED ORDER — MOXIFLOXACIN HCL 400 MG PO TABS: 400.0000 mg | ORAL_TABLET | Freq: Every day | ORAL | 0 refills | Status: AC

## 2017-11-27 NOTE — Patient Instructions (Signed)

## 2017-11-27 NOTE — Progress Notes (Signed)
Subjective:  Patient ID: William Bauer, male    DOB: 03-Sep-1956  Age: 62 y.o. MRN: [id number]  CC: Sinusitis   HPI William Bauer presents for a 3-day history of chills, night sweats, sore throat, fever to 101, nonproductive cough, and thick yellow-green nasal phlegm with pain in his frontal sinuses.  He was seen by someone else a few days ago and started Tamiflu, he was not tested for flu, the Tamiflu has not helped.  Outpatient Medications Prior to Visit  Medication Sig Dispense Refill  . cholecalciferol (VITAMIN D) 1000 UNITS tablet Take 1,000 Units by mouth daily.    . cyclobenzaprine (FLEXERIL) 10 MG tablet Take 1 tablet (10 mg total) by mouth at bedtime. 30 tablet 2  . Fluticasone-Salmeterol (ADVAIR HFA IN) Inhale into the lungs as directed.    . methylphenidate (RITALIN) 10 MG tablet Take 1 tablet (10 mg total) by mouth 2 (two) times daily. 60 tablet 0  . Omega-3 Fatty Acids (FISH OIL OMEGA-3 PO) Take by mouth.    Marland Kitchen omeprazole (PRILOSEC) 40 MG capsule TAKE 1 CAPSULE EVERY DAY. 30 capsule 0  . ondansetron (ZOFRAN ODT) 8 MG disintegrating tablet Take 1 tablet (8 mg total) by mouth every 8 (eight) hours as needed for nausea or vomiting. 20 tablet 0  . oseltamivir (TAMIFLU) 75 MG capsule Take 1 capsule (75 mg total) by mouth every 12 (twelve) hours for 5 days. 10 capsule 0   No facility-administered medications prior to visit.     ROS Review of Systems  Constitutional: Positive for chills and fever. Negative for diaphoresis and fatigue.  HENT: Positive for postnasal drip, rhinorrhea, sinus pressure and sinus pain. Negative for facial swelling, nosebleeds, sore throat and trouble swallowing.   Eyes: Negative.   Respiratory: Positive for cough. Negative for chest tightness, shortness of breath and wheezing.   Cardiovascular: Negative for chest pain, palpitations and leg swelling.  Gastrointestinal: Negative for abdominal pain, diarrhea, nausea and vomiting.  Endocrine:  Negative.   Genitourinary: Negative.   Musculoskeletal: Negative.  Negative for myalgias and neck pain.  Skin: Negative.  Negative for rash.  Allergic/Immunologic: Negative.   Neurological: Negative.  Negative for dizziness, weakness and headaches.  Hematological: Negative for adenopathy. Does not bruise/bleed easily.  Psychiatric/Behavioral: Negative.     Objective:  BP 120/78 (BP Location: Left Arm, Patient Position: Sitting, Cuff Size: Large)   Pulse 78   Temp 97.8 F (36.6 C) (Oral)   Resp 16   Ht 5\' 11"  (1.803 m)   Wt 206 lb (93.4 kg)   SpO2 96%   BMI 28.73 kg/m   BP Readings from Last 3 Encounters:  11/27/17 120/78  11/26/17 (!) 159/95  10/24/17 130/90    Wt Readings from Last 3 Encounters:  11/27/17 206 lb (93.4 kg)  10/24/17 209 lb (94.8 kg)  03/13/17 209 lb (94.8 kg)    Physical Exam  Constitutional: He is oriented to person, place, and time. No distress.  HENT:  Right Ear: Hearing, tympanic membrane, external ear and ear canal normal.  Left Ear: Hearing, tympanic membrane, external ear and ear canal normal. No decreased hearing is noted.  Nose: Rhinorrhea present. No mucosal edema. Right sinus exhibits frontal sinus tenderness. Right sinus exhibits no maxillary sinus tenderness. Left sinus exhibits frontal sinus tenderness. Left sinus exhibits no maxillary sinus tenderness.  Mouth/Throat: Oropharynx is clear and moist. No oropharyngeal exudate.  Eyes: Conjunctivae are normal. Left eye exhibits no discharge. No scleral icterus.  Cardiovascular: Normal rate, regular rhythm  and normal heart sounds.  No murmur heard. Pulmonary/Chest: Effort normal and breath sounds normal. No respiratory distress. He has no wheezes. He has no rales.  Abdominal: Soft. Bowel sounds are normal. He exhibits no mass. There is no tenderness.  Musculoskeletal: Normal range of motion. He exhibits no edema, tenderness or deformity.  Neurological: He is alert and oriented to person, place,  and time.  Skin: Skin is warm and dry. No rash noted. He is not diaphoretic. No erythema. No pallor.  Vitals reviewed.   Lab Results  Component Value Date   WBC 7.5 10/24/2017   HGB 16.4 10/24/2017   HCT 49.6 10/24/2017   PLT 285.0 10/24/2017   GLUCOSE 78 10/24/2017   CHOL 205 (H) 10/24/2017   TRIG 165.0 (H) 10/24/2017   HDL 50.80 10/24/2017   LDLCALC 121 (H) 10/24/2017   ALT 23 10/24/2017   AST 19 10/24/2017   NA 138 10/24/2017   K 3.8 10/24/2017   CL 102 10/24/2017   CREATININE 1.03 10/24/2017   BUN 13 10/24/2017   CO2 30 10/24/2017   TSH 1.60 03/08/2016   PSA 0.64 10/24/2017    No results found.  Assessment & Plan:   William Bauer was seen today for sinusitis.  Diagnoses and all orders for this visit:  Acute non-recurrent frontal sinusitis- he is allergic to penicillin so I will treat the infection with a fluoroquinolone. -     moxifloxacin (AVELOX) 400 MG tablet; Take 1 tablet (400 mg total) by mouth daily at 8 pm for 10 days.   I am having William Bauer start on moxifloxacin. I am also having him maintain his cholecalciferol, Fluticasone-Salmeterol (ADVAIR HFA IN), Omega-3 Fatty Acids (FISH OIL OMEGA-3 PO), omeprazole, methylphenidate, cyclobenzaprine, oseltamivir, and ondansetron.  Meds ordered this encounter  Medications  . moxifloxacin (AVELOX) 400 MG tablet    Sig: Take 1 tablet (400 mg total) by mouth daily at 8 pm for 10 days.    Dispense:  10 tablet    Refill:  0     Follow-up: Return if symptoms worsen or fail to improve.  William Lingerhomas Anastyn Ayars, MD

## 2017-11-27 NOTE — ED Provider Notes (Signed)
[facility]    CSN: [id number] Arrival date & time: 11/26/17  1829     History   Chief Complaint Chief Complaint  Patient presents with  . URI    HPI William Bauer is a 62 y.o. male.   62 year old male presents with nasal congestion, sore throat, fever of 100, cough and body aches that started yesterday. Symptoms have worsened today with headache and fatigue along with mild diarrhea. Slight nausea but no vomiting. Has taken Guaifenesin, antihistamine, steroid nasal spray, and OTC cold and flu medications with no relief. Did have a flu vaccine. No other family members ill. Has history of recurrent sinus problems but has never felt this "bad". Also has a history of GERD and ADD- currently on Prilosec and Ritalin daily.    The history is provided by the patient.    Past Medical History:  Diagnosis Date  . Allergy   . Arthritis    hands   . Chicken pox   . Colon polyps   . GERD (gastroesophageal reflux disease)   . Hyperlipidemia   . Sinus trouble   . Sleep apnea     Patient Active Problem List   Diagnosis Date Noted  . Low back pain with sciatica 10/24/2017  . ADHD (attention deficit hyperactivity disorder), inattentive type 10/10/2017  . Hx of colonic polyps 12/19/2016  . Hemorrhoids, internal, with bleeding 12/19/2016  . Chronic cough 05/31/2016  . Dysphagia 04/05/2016  . Routine general medical examination at a health care facility 01/07/2015  . Hyperlipidemia 12/24/2014  . Seasonal and perennial allergic rhinitis 11/23/2014  . Food allergy 11/23/2014  . GERD (gastroesophageal reflux disease) 11/23/2014  . Obstructive sleep apnea 11/23/2014    Past Surgical History:  Procedure Laterality Date  . Cartlage removed     Right knee       Home Medications    Prior to Admission medications   Medication Sig Start Date End Date Taking? Authorizing Provider  cholecalciferol (VITAMIN D) 1000 UNITS tablet Take 1,000 Units by mouth daily.     [provider]  cyclobenzaprine (FLEXERIL) 10 MG tablet Take 1 tablet (10 mg total) by mouth at bedtime. 10/24/17   Etta Grandchild, MD  Fluticasone-Salmeterol (ADVAIR HFA IN) Inhale into the lungs as directed.    [provider]  methylphenidate (RITALIN) 10 MG tablet Take 1 tablet (10 mg total) by mouth 2 (two) times daily. 10/10/17   Etta Grandchild, MD  Omega-3 Fatty Acids (FISH OIL OMEGA-3 PO) Take by mouth.    [provider]  omeprazole (PRILOSEC) 40 MG capsule TAKE 1 CAPSULE EVERY DAY. 04/24/17   Leslye Peer, MD  ondansetron (ZOFRAN ODT) 8 MG disintegrating tablet Take 1 tablet (8 mg total) by mouth every 8 (eight) hours as needed for nausea or vomiting. 11/26/17   Damario Gillie, Ali Lowe, NP  oseltamivir (TAMIFLU) 75 MG capsule Take 1 capsule (75 mg total) by mouth every 12 (twelve) hours for 5 days. 11/26/17 12/01/17  Sudie Grumbling, NP    Family History Family History  Problem Relation Age of Onset  . Diabetes Maternal Aunt   . Breast cancer Maternal Aunt   . Lung cancer Father   . Hyperlipidemia Father   . Peripheral Artery Disease Mother   . Macular degeneration Mother   . Arthritis Mother   . Hyperlipidemia Mother   . Stroke Mother   . Hypertension Mother   . Heart disease Maternal Grandfather   . Hyperlipidemia Maternal  Grandfather   . Heart disease Paternal Grandfather   . Hyperlipidemia Paternal Grandfather   . Alcohol abuse Maternal Uncle     Social History Social History   Tobacco Use  . Smoking status: Never Smoker  . Smokeless tobacco: Never Used  Substance Use Topics  . Alcohol use: Yes    Alcohol/week: 0.0 oz    Comment: 2 drinks monthly  . Drug use: No     Allergies   Penicillins and Yellow jacket venom [bee venom]   Review of Systems Review of Systems  Constitutional: Positive for activity change, appetite change, chills, fatigue and fever.  HENT: Positive for congestion, postnasal drip, rhinorrhea, sinus pressure,  sneezing and sore throat. Negative for ear discharge, ear pain, facial swelling, hearing loss, mouth sores, nosebleeds, sinus pain and trouble swallowing.   Eyes: Negative for pain, discharge, redness and itching.  Respiratory: Positive for cough. Negative for chest tightness, shortness of breath and wheezing.   Cardiovascular: Negative for chest pain.  Gastrointestinal: Positive for diarrhea and nausea. Negative for abdominal pain and vomiting.  Genitourinary: Negative for decreased urine volume, difficulty urinating and dysuria.  Musculoskeletal: Positive for arthralgias and myalgias. Negative for back pain, neck pain and neck stiffness.  Skin: Negative for color change, rash and wound.  Neurological: Positive for headaches. Negative for dizziness, tremors, seizures, syncope, weakness, light-headedness and numbness.  Hematological: Negative for adenopathy. Does not bruise/bleed easily.     Physical Exam Triage Vital Signs ED Triage Vitals  Enc Vitals Group     BP 11/26/17 1857 (!) 159/95     Pulse Rate 11/26/17 1857 (!) 102     Resp 11/26/17 1857 (!) 22     Temp 11/26/17 1857 (!) 101.1 F (38.4 C)     Temp Source 11/26/17 1857 Oral     SpO2 11/26/17 1857 98 %     Weight --      Height --      Head Circumference --      Peak Flow --      Pain Score 11/26/17 1854 3     Pain Loc --      Pain Edu? --      Excl. in GC? --    No data found.  Updated Vital Signs BP (!) 159/95 (BP Location: Left Arm)   Pulse (!) 102   Temp (!) 101.1 F (38.4 C) (Oral)   Resp (!) 22   SpO2 98%   Visual Acuity Right Eye Distance:   Left Eye Distance:   Bilateral Distance:    Right Eye Near:   Left Eye Near:    Bilateral Near:     Physical Exam  Constitutional: He is oriented to person, place, and time. He appears well-developed and well-nourished. He appears ill. No distress.  Patient sitting on exam table and appears ill but in no distress.   HENT:  Head: Normocephalic and atraumatic.   Right Ear: Hearing, tympanic membrane, external ear and ear canal normal.  Left Ear: Hearing, tympanic membrane, external ear and ear canal normal.  Nose: Mucosal edema and rhinorrhea present. Right sinus exhibits no maxillary sinus tenderness and no frontal sinus tenderness. Left sinus exhibits no maxillary sinus tenderness and no frontal sinus tenderness.  Mouth/Throat: Uvula is midline and mucous membranes are normal. Posterior oropharyngeal erythema present.  Eyes: Conjunctivae and EOM are normal.  Neck: Normal range of motion.  Cardiovascular: Regular rhythm and normal heart sounds. Tachycardia present.  No murmur heard. Pulmonary/Chest: Effort normal and  breath sounds normal. No accessory muscle usage or stridor. Tachypnea noted. No respiratory distress. He has no decreased breath sounds. He has no wheezes. He has no rhonchi. He has no rales.  Musculoskeletal: Normal range of motion.  Lymphadenopathy:    He has no cervical adenopathy.  Neurological: He is alert and oriented to person, place, and time.  Skin: Skin is warm and dry. Capillary refill takes less than 2 seconds. No rash noted. No erythema.  Psychiatric: He has a normal mood and affect. His behavior is normal. Judgment and thought content normal.     UC Treatments / Results  Labs (all labs ordered are listed, but only abnormal results are displayed) Labs Reviewed - No data to display  EKG  EKG Interpretation None       Radiology No results found.  Procedures Procedures (including critical care time)  Medications Ordered in UC Medications  acetaminophen (TYLENOL) tablet 650 mg (650 mg Oral Given 11/26/17 1903)     Initial Impression / Assessment and Plan / UC Course  I have reviewed the triage vital signs and the nursing notes.  Pertinent labs & imaging results that were available during my care of the patient were reviewed by me and considered in my medical decision making (see chart for details).      Reviewed with patient that he most likely has influenza. Discussed treatment options. May start Tamiflu 75mg  twice a day as directed. Continue to push fluids. May take Tylenol 1000mg  every 8 hours as needed for fever. Continue OTC cold and cough medication as needed for symptoms. May use Zofran 8mg  every 8 hours as needed for nausea. Rest. Follow-up in 3 days with his PCP if not improving.    Final Clinical Impressions(s) / UC Diagnoses   Final diagnoses:  Influenza    ED Discharge Orders        Ordered    oseltamivir (TAMIFLU) 75 MG capsule  Every 12 hours     11/26/17 1947    ondansetron (ZOFRAN ODT) 8 MG disintegrating tablet  Every 8 hours PRN     11/26/17 1947       Controlled Substance Prescriptions [place] Controlled Substance Registry consulted? Not Applicable   Sudie Grumbling, NP 11/27/17 [phone]

## 2017-12-12 ENCOUNTER — Other Ambulatory Visit: Payer: Self-pay | Admitting: Internal Medicine

## 2017-12-12 DIAGNOSIS — F900 Attention-deficit hyperactivity disorder, predominantly inattentive type: Secondary | ICD-10-CM

## 2018-01-31 ENCOUNTER — Other Ambulatory Visit: Payer: Self-pay | Admitting: Internal Medicine

## 2018-01-31 DIAGNOSIS — F900 Attention-deficit hyperactivity disorder, predominantly inattentive type: Secondary | ICD-10-CM

## 2018-03-06 ENCOUNTER — Other Ambulatory Visit: Payer: Self-pay | Admitting: Internal Medicine

## 2018-03-06 DIAGNOSIS — F900 Attention-deficit hyperactivity disorder, predominantly inattentive type: Secondary | ICD-10-CM

## 2018-04-16 ENCOUNTER — Other Ambulatory Visit: Payer: Self-pay | Admitting: Internal Medicine

## 2018-04-16 DIAGNOSIS — F900 Attention-deficit hyperactivity disorder, predominantly inattentive type: Secondary | ICD-10-CM

## 2018-06-05 ENCOUNTER — Other Ambulatory Visit: Payer: Self-pay | Admitting: Internal Medicine

## 2018-06-05 DIAGNOSIS — F900 Attention-deficit hyperactivity disorder, predominantly inattentive type: Secondary | ICD-10-CM

## 2018-06-05 NOTE — Telephone Encounter (Signed)
Pt is due for a follow up appt.

## 2018-06-06 NOTE — Telephone Encounter (Signed)
Pt called back and scheduled ov for 06/19/18, pt would like to know if provider could take care of Rx until his apt?    Please advise

## 2018-06-06 NOTE — Telephone Encounter (Signed)
Left message for patient to call back to schedule.  °

## 2018-06-19 ENCOUNTER — Ambulatory Visit: Payer: PRIVATE HEALTH INSURANCE | Admitting: Internal Medicine

## 2018-06-19 ENCOUNTER — Encounter: Payer: Self-pay | Admitting: Internal Medicine

## 2018-06-19 VITALS — BP 132/90 | HR 67 | Temp 97.9°F | Ht 71.0 in | Wt 204.0 lb

## 2018-06-19 DIAGNOSIS — K2100 Gastro-esophageal reflux disease with esophagitis, without bleeding: Secondary | ICD-10-CM

## 2018-06-19 DIAGNOSIS — I10 Essential (primary) hypertension: Secondary | ICD-10-CM | POA: Diagnosis not present

## 2018-06-19 DIAGNOSIS — F320 Major depressive disorder, single episode, mild: Secondary | ICD-10-CM | POA: Insufficient documentation

## 2018-06-19 MED ORDER — VORTIOXETINE HBR 10 MG PO TABS: 10.0000 mg | ORAL_TABLET | Freq: Every day | ORAL | 0 refills | Status: DC

## 2018-06-19 MED ORDER — VORTIOXETINE HBR 5 MG PO TABS: 5.0000 mg | ORAL_TABLET | Freq: Every day | ORAL | 0 refills | Status: DC

## 2018-06-19 NOTE — Progress Notes (Signed)
Subjective:  Patient ID: William Headlandnthony Frankson, male    DOB: [birth year]  Age: 62 y.o. MRN: [id number]  CC: Gastroesophageal Reflux and Depression   HPI William Bauer presents for f/up - His main complaint today is stress.  He tells me his daughter recently overdosed and [place] in [place].  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  He thought he was becoming depressed and anxious so he recently restarted an old prescription for Wellbutrin but he says it made him feel overstimulated so he stopped taking it.  He complains of anxiety, insomnia, a slight decrease in appetite and weight loss.  He denies suicidal ideations.  He denies feeling worthless, helpless, or hopeless.  He is otherwise tolerating Ritalin well.  He also tells me that about 2 weeks ago he had an episode of chest pain at rest.  He describes it as a burning sensation in his esophagus.  He has a history of heartburn and GERD which is not currently being treated.  He denies DOE, diaphoresis, palpitations, edema, dizziness, or lightheadedness.  Outpatient Medications Prior to Visit  Medication Sig Dispense Refill  . cyclobenzaprine (FLEXERIL) 10 MG tablet Take 1 tablet (10 mg total) by mouth at bedtime. 30 tablet 2  . Fluticasone-Salmeterol (ADVAIR HFA IN) Inhale into the lungs as directed.    . methylphenidate (RITALIN) 10 MG tablet TAKE 1 TABLET BY MOUTH TWICE DAILY. 60 tablet 0  . omeprazole (PRILOSEC) 40 MG capsule TAKE 1 CAPSULE EVERY DAY. 30 capsule 0  . cholecalciferol (VITAMIN D) 1000 UNITS tablet Take 1,000 Units by mouth daily.    . Omega-3 Fatty Acids (FISH OIL OMEGA-3 PO) Take by mouth.    . ondansetron (ZOFRAN ODT) 8 MG disintegrating tablet Take 1 tablet (8 mg total) by mouth every 8 (eight) hours as needed for nausea or vomiting. 20 tablet 0   No facility-administered medications prior to visit.     ROS Review of Systems  Constitutional: Positive for  appetite change and unexpected weight change. Negative for diaphoresis and fatigue.  HENT: Negative.  Negative for sore throat, trouble swallowing and voice change.   Respiratory: Negative for cough, chest tightness, shortness of breath and wheezing.   Cardiovascular: Negative for chest pain, palpitations and leg swelling.  Gastrointestinal: Negative for abdominal pain, constipation, diarrhea, nausea and vomiting.  Genitourinary: Negative.  Negative for difficulty urinating.  Musculoskeletal: Negative.  Negative for arthralgias and myalgias.  Skin: Negative.  Negative for color change and pallor.  Neurological: Negative.  Negative for dizziness, weakness, light-headedness and headaches.  Hematological: Negative for adenopathy. Does not bruise/bleed easily.  Psychiatric/Behavioral: Positive for decreased concentration, dysphoric mood and sleep disturbance. Negative for agitation, behavioral problems, confusion, hallucinations, self-injury and suicidal ideas. The patient is nervous/anxious. The patient is not hyperactive.     Objective:  BP 132/90 (BP Location: Left Arm, Patient Position: Sitting, Cuff Size: Normal)   Pulse 67   Temp 97.9 F (36.6 C) (Oral)   Ht 5\' 11"  (1.803 m)   Wt 204 lb (92.5 kg)   SpO2 96%   BMI 28.45 kg/m   BP Readings from Last 3 Encounters:  06/19/18 132/90  11/27/17 120/78  11/26/17 (!) 159/95    Wt Readings from Last 3 Encounters:  06/19/18 204 lb (92.5 kg)  11/27/17 206 lb (93.4 kg)  10/24/17 209 lb (94.8 kg)    Physical Exam  Constitutional: He is oriented to person, place, and  time. No distress.  HENT:  Mouth/Throat: Oropharynx is clear and moist. No oropharyngeal exudate.  Eyes: Conjunctivae are normal. No scleral icterus.  Neck: Normal range of motion. Neck supple. No JVD present. No thyromegaly present.  Cardiovascular: Normal rate, regular rhythm and normal heart sounds. Exam reveals no gallop.  No murmur heard. EKG -----  Sinus  Rhythm    WITHIN NORMAL LIMITS- no change compared to the prior EKG  Pulmonary/Chest: Effort normal and breath sounds normal. No respiratory distress. He has no wheezes. He has no rhonchi. He has no rales.  Abdominal: Soft. Bowel sounds are normal. He exhibits no mass. There is no hepatosplenomegaly. There is no tenderness.  Musculoskeletal: Normal range of motion. He exhibits no edema, tenderness or deformity.  Lymphadenopathy:    He has no cervical adenopathy.  Neurological: He is alert and oriented to person, place, and time.  Skin: Skin is warm and dry. No rash noted. He is not diaphoretic.  Psychiatric: Judgment and thought content normal. His mood appears anxious. His speech is not rapid and/or pressured, not delayed, not tangential and not slurred. He is not agitated, not aggressive, not hyperactive, not slowed, not withdrawn and not combative. Thought content is not paranoid. Cognition and memory are normal. He exhibits a depressed mood. He expresses no homicidal and no suicidal ideation.  Vitals reviewed.   Lab Results  Component Value Date   WBC 7.5 10/24/2017   HGB 16.4 10/24/2017   HCT 49.6 10/24/2017   PLT 285.0 10/24/2017   GLUCOSE 78 10/24/2017   CHOL 205 (H) 10/24/2017   TRIG 165.0 (H) 10/24/2017   HDL 50.80 10/24/2017   LDLCALC 121 (H) 10/24/2017   ALT 23 10/24/2017   AST 19 10/24/2017   NA 138 10/24/2017   K 3.8 10/24/2017   CL 102 10/24/2017   CREATININE 1.03 10/24/2017   BUN 13 10/24/2017   CO2 30 10/24/2017   TSH 1.60 03/08/2016   PSA 0.64 10/24/2017    No results found.  Assessment & Plan:   [name] was seen today for gastroesophageal reflux and depression.  Diagnoses and all orders for this visit:  Hypertension, unspecified type- He has stage I hypertension.  His EKG is negative for ischemia or LVH.  He is not willing to start an antihypertensive at this time.  He agrees to work on his lifestyle modifications. -     EKG 12-Lead  Current mild episode of  major depressive disorder without prior episode (HCC)- Will start treating his depression with Trintellix.  He will take 5 mg a day for 2 weeks, then 10 mg a day for 2 weeks, then we will decide whether or not he stays at 10 mg a day or advance to 20 mg a day. -     vortioxetine HBr (TRINTELLIX) 5 MG TABS tablet; Take 1 tablet (5 mg total) by mouth daily. -     vortioxetine HBr (TRINTELLIX) 10 MG TABS tablet; Take 1 tablet (10 mg total) by mouth daily.  Gastroesophageal reflux disease with esophagitis- I think this explains his recent episode of chest pain.  I have asked him to restart the PPI. -     omeprazole (PRILOSEC) 40 MG capsule; Take 1 capsule (40 mg total) by mouth daily.   I have discontinued Orpah MelterAnthony Pavek's cholecalciferol, Omega-3 Fatty Acids (FISH OIL OMEGA-3 PO), and ondansetron. I have also changed his omeprazole. Additionally, I am having him start on vortioxetine HBr and vortioxetine HBr. Lastly, I am having him maintain his  Fluticasone-Salmeterol (ADVAIR HFA IN), cyclobenzaprine, and methylphenidate.  Meds ordered this encounter  Medications  . vortioxetine HBr (TRINTELLIX) 5 MG TABS tablet    Sig: Take 1 tablet (5 mg total) by mouth daily.    Dispense:  14 tablet    Refill:  0  . vortioxetine HBr (TRINTELLIX) 10 MG TABS tablet    Sig: Take 1 tablet (10 mg total) by mouth daily.    Dispense:  14 tablet    Refill:  0  . omeprazole (PRILOSEC) 40 MG capsule    Sig: Take 1 capsule (40 mg total) by mouth daily.    Dispense:  90 capsule    Refill:  1     Follow-up: Return in about 4 weeks (around 07/17/2018).  Sanda Linger, MD

## 2018-06-19 NOTE — Patient Instructions (Signed)
Major Depressive Disorder, Adult Major depressive disorder (MDD) is a mental health condition. It may also be called clinical depression or unipolar depression. MDD usually causes feelings of sadness, hopelessness, or helplessness. MDD can also cause physical symptoms. It can interfere with work, school, relationships, and other everyday activities. MDD may be mild, moderate, or severe. It may occur once (single episode major depressive disorder) or it may occur multiple times (recurrent major depressive disorder). What are the causes? The exact cause of this condition is not known. MDD is most likely caused by a combination of things, which may include:  Genetic factors. These are traits that are passed along from parent to child.  Individual factors. Your personality, your behavior, and the way you handle your thoughts and feelings may contribute to MDD. This includes personality traits and behaviors learned from others.  Physical factors, such as: ? Differences in the part of your brain that controls emotion. This part of your brain may be different than it is in people who do not have MDD. ? Long-term (chronic) medical or psychiatric illnesses.  Social factors. Traumatic experiences or major life changes may play a role in the development of MDD.  What increases the risk? This condition is more likely to develop in women. The following factors may also make you more likely to develop MDD:  A family history of depression.  Troubled family relationships.  Abnormally low levels of certain brain chemicals.  Traumatic events in childhood, especially abuse or the loss of a parent.  Being under a lot of stress, or long-term stress, especially from upsetting life experiences or losses.  A history of: ? Chronic physical illness. ? Other mental health disorders. ? Substance abuse.  Poor living conditions.  Experiencing social exclusion or discrimination on a regular basis.  What are  the signs or symptoms? The main symptoms of MDD typically include:  Constant depressed or irritable mood.  Loss of interest in things and activities.  MDD symptoms may also include:  Sleeping or eating too much or too little.  Unexplained weight change.  Fatigue or low energy.  Feelings of worthlessness or guilt.  Difficulty thinking clearly or making decisions.  Thoughts of suicide or of harming others.  Physical agitation or weakness.  Isolation.  Severe cases of MDD may also occur with other symptoms, such as:  Delusions or hallucinations, in which you imagine things that are not real (psychotic depression).  Low-level depression that lasts at least a year (chronic depression or persistent depressive disorder).  Extreme sadness and hopelessness (melancholic depression).  Trouble speaking and moving (catatonic depression).  How is this diagnosed? This condition may be diagnosed based on:  Your symptoms.  Your medical history, including your mental health history. This may involve tests to evaluate your mental health. You may be asked questions about your lifestyle, including any drug and alcohol use, and how long you have had symptoms of MDD.  A physical exam.  Blood tests to rule out other conditions.  You must have a depressed mood and at least four other MDD symptoms most of the day, nearly every day in the same 2-week timeframe before your health care provider can confirm a diagnosis of MDD. How is this treated? This condition is usually treated by mental health professionals, such as psychologists, psychiatrists, and clinical social workers. You may need more than one type of treatment. Treatment may include:  Psychotherapy. This is also called talk therapy or counseling. Types of psychotherapy include: ? Cognitive behavioral   therapy (CBT). This type of therapy teaches you to recognize unhealthy feelings, thoughts, and behaviors, and replace them with  positive thoughts and actions. ? Interpersonal therapy (IPT). This helps you to improve the way you relate to and communicate with others. ? Family therapy. This treatment includes members of your family.  Medicine to treat anxiety and depression, or to help you control certain emotions and behaviors.  Lifestyle changes, such as: ? Limiting alcohol and drug use. ? Exercising regularly. ? Getting plenty of sleep. ? Making healthy eating choices. ? Spending more time outdoors.  Treatments involving stimulation of the brain can be used in situations with extremely severe symptoms, or when medicine or other therapies do not work over time. These treatments include electroconvulsive therapy, transcranial magnetic stimulation, and vagal nerve stimulation. Follow these instructions at home: Activity  Return to your normal activities as told by your health care provider.  Exercise regularly and spend time outdoors as told by your health care provider. General instructions  Take over-the-counter and prescription medicines only as told by your health care provider.  Do not drink alcohol. If you drink alcohol, limit your alcohol intake to no more than 1 drink a day for nonpregnant women and 2 drinks a day for men. One drink equals 12 oz of beer, 5 oz of wine, or 1 oz of hard liquor. Alcohol can affect any antidepressant medicines you are taking. Talk to your health care provider about your alcohol use.  Eat a healthy diet and get plenty of sleep.  Find activities that you enjoy doing, and make time to do them.  Consider joining a support group. Your health care provider may be able to recommend a support group.  Keep all follow-up visits as told by your health care provider. This is important. Where to find more information: National Alliance on Mental Illness  www.nami.org  U.S. National Institute of Mental Health  www.nimh.nih.gov  National Suicide Prevention  Lifeline  1-800-273-TALK (8255). This is free, 24-hour help.  Contact a health care provider if:  Your symptoms get worse.  You develop new symptoms. Get help right away if:  You self-harm.  You have serious thoughts about hurting yourself or others.  You see, hear, taste, smell, or feel things that are not present (hallucinate). This information is not intended to replace advice given to you by your health care provider. Make sure you discuss any questions you have with your health care provider. Document Released: 02/16/2013 Document Revised: 06/28/2016 Document Reviewed: 05/02/2016 Elsevier Interactive Patient Education  2018 Elsevier Inc.  

## 2018-06-21 MED ORDER — OMEPRAZOLE 40 MG PO CPDR: 40.0000 mg | DELAYED_RELEASE_CAPSULE | Freq: Every day | ORAL | 1 refills | Status: AC

## 2018-07-15 ENCOUNTER — Other Ambulatory Visit: Payer: Self-pay | Admitting: Internal Medicine

## 2018-07-15 ENCOUNTER — Telehealth: Payer: Self-pay | Admitting: Internal Medicine

## 2018-07-15 DIAGNOSIS — F320 Major depressive disorder, single episode, mild: Secondary | ICD-10-CM

## 2018-07-15 DIAGNOSIS — F900 Attention-deficit hyperactivity disorder, predominantly inattentive type: Secondary | ICD-10-CM

## 2018-07-15 MED ORDER — VORTIOXETINE HBR 10 MG PO TABS: 10.0000 mg | ORAL_TABLET | Freq: Every day | ORAL | 1 refills | Status: DC

## 2018-07-15 MED ORDER — METHYLPHENIDATE HCL 10 MG PO TABS: 10.0000 mg | ORAL_TABLET | Freq: Two times a day (BID) | ORAL | 0 refills | Status: DC

## 2018-07-15 NOTE — Telephone Encounter (Signed)
Copied from CRM [id number]. Topic: Quick Communication - Rx Refill/Question >> Jul 15, 2018  9:22 AM Crist Infante wrote: Medication: (1) methylphenidate (RITALIN) 10 MG tablet Also pt states the (2) vortioxetine HBr (TRINTELLIX) 10 MG TABS tablet is working great and he is requesting a new rx 90 day sent to  [facility] [place], [place] - [address] [phone] (Phone) [phone] (Fax)

## 2018-07-21 ENCOUNTER — Ambulatory Visit: Payer: PRIVATE HEALTH INSURANCE | Admitting: Internal Medicine

## 2018-07-21 ENCOUNTER — Encounter: Payer: Self-pay | Admitting: Internal Medicine

## 2018-07-21 VITALS — BP 130/80 | HR 86 | Temp 98.3°F | Ht 71.0 in | Wt 207.8 lb

## 2018-07-21 DIAGNOSIS — J302 Other seasonal allergic rhinitis: Secondary | ICD-10-CM | POA: Diagnosis not present

## 2018-07-21 DIAGNOSIS — Z23 Encounter for immunization: Secondary | ICD-10-CM

## 2018-07-21 DIAGNOSIS — J069 Acute upper respiratory infection, unspecified: Secondary | ICD-10-CM | POA: Diagnosis not present

## 2018-07-21 DIAGNOSIS — J3089 Other allergic rhinitis: Secondary | ICD-10-CM | POA: Diagnosis not present

## 2018-07-21 MED ORDER — METHYLPREDNISOLONE 4 MG PO TBPK: ORAL_TABLET | ORAL | 0 refills | Status: AC

## 2018-07-21 MED ORDER — TRIAMCINOLONE ACETONIDE 55 MCG/ACT NA AERO: 4.0000 | INHALATION_SPRAY | Freq: Every day | NASAL | 3 refills | Status: DC

## 2018-07-21 NOTE — Progress Notes (Signed)
Subjective:  Patient ID: William Headlandnthony Harpenau, male    DOB: 1956/06/17  Age: 62 y.o. MRN: [id number]  CC: URI and Allergic Rhinitis    HPI William Headlandnthony Lamos presents for a 4-day history of sore throat, low-grade fever, chills, nasal congestion, NP cough and postnasal drip.  He has not gotten much symptom relief with an oral antihistamine and an antihistamine nasal spray.  Outpatient Medications Prior to Visit  Medication Sig Dispense Refill  . azelastine (ASTELIN) 0.1 % nasal spray Place into both nostrils 2 (two) times daily. Use in each nostril as directed    . cyclobenzaprine (FLEXERIL) 10 MG tablet Take 1 tablet (10 mg total) by mouth at bedtime. 30 tablet 2  . Fluticasone-Salmeterol (ADVAIR HFA IN) Inhale into the lungs as directed.    . methylphenidate (RITALIN) 10 MG tablet Take 1 tablet (10 mg total) by mouth 2 (two) times daily. 60 tablet 0  . omeprazole (PRILOSEC) 40 MG capsule Take 1 capsule (40 mg total) by mouth daily. 90 capsule 1  . vortioxetine HBr (TRINTELLIX) 10 MG TABS tablet Take 1 tablet (10 mg total) by mouth daily. 90 tablet 1   No facility-administered medications prior to visit.     ROS Review of Systems  Constitutional: Positive for chills and fever. Negative for diaphoresis and fatigue.  HENT: Positive for congestion, postnasal drip, rhinorrhea and sore throat. Negative for facial swelling, nosebleeds, sinus pressure, sinus pain, sneezing, trouble swallowing and voice change.   Eyes: Negative.   Respiratory: Positive for cough. Negative for chest tightness, shortness of breath and wheezing.   Cardiovascular: Negative for chest pain, palpitations and leg swelling.  Gastrointestinal: Negative for abdominal pain, constipation, diarrhea, nausea and vomiting.  Endocrine: Negative.   Genitourinary: Negative.  Negative for difficulty urinating.  Musculoskeletal: Negative.  Negative for arthralgias, joint swelling and myalgias.  Skin: Negative.  Negative for rash.    Neurological: Negative.  Negative for dizziness, weakness and light-headedness.  Hematological: Negative for adenopathy. Does not bruise/bleed easily.  Psychiatric/Behavioral: Negative.     Objective:  BP 130/80 (BP Location: Left Arm, Patient Position: Sitting, Cuff Size: Normal)   Pulse 86   Temp 98.3 F (36.8 C) (Oral)   Ht 5\' 11"  (1.803 m)   Wt 207 lb 12 oz (94.2 kg)   SpO2 95%   BMI 28.98 kg/m   BP Readings from Last 3 Encounters:  07/21/18 130/80  06/19/18 132/90  11/27/17 120/78    Wt Readings from Last 3 Encounters:  07/21/18 207 lb 12 oz (94.2 kg)  06/19/18 204 lb (92.5 kg)  11/27/17 206 lb (93.4 kg)    Physical Exam  Constitutional:  Non-toxic appearance. He does not have a sickly appearance. He does not appear ill. No distress.  HENT:  Nose: Mucosal edema and rhinorrhea present. No sinus tenderness. Right sinus exhibits no maxillary sinus tenderness and no frontal sinus tenderness. Left sinus exhibits no maxillary sinus tenderness and no frontal sinus tenderness.  Mouth/Throat: Mucous membranes are normal. Mucous membranes are not pale, not dry and not cyanotic. No trismus in the jaw. Posterior oropharyngeal erythema present. No oropharyngeal exudate, posterior oropharyngeal edema or tonsillar abscesses. Tonsils are 0 on the right. Tonsils are 0 on the left.  Eyes: Conjunctivae are normal. No scleral icterus.  Neck: Normal range of motion. Neck supple. No JVD present. No thyroid mass and no thyromegaly present.  Cardiovascular: Normal rate, regular rhythm and normal heart sounds. Exam reveals no gallop and no friction rub.  No murmur  heard. Pulmonary/Chest: Effort normal and breath sounds normal. He has no wheezes. He has no rales.  Abdominal: Soft. Bowel sounds are normal. He exhibits no mass. There is no hepatosplenomegaly. There is no tenderness.  Lymphadenopathy:       Head (right side): No occipital adenopathy present.       Head (left side): No occipital  adenopathy present.    He has no cervical adenopathy.       Right cervical: No superficial cervical, no deep cervical and no posterior cervical adenopathy present.      Left cervical: No superficial cervical, no deep cervical and no posterior cervical adenopathy present.    He has no axillary adenopathy.       Right: No supraclavicular adenopathy present.       Left: No supraclavicular adenopathy present.  Skin: He is not diaphoretic.    Lab Results  Component Value Date   WBC 7.5 10/24/2017   HGB 16.4 10/24/2017   HCT 49.6 10/24/2017   PLT 285.0 10/24/2017   GLUCOSE 78 10/24/2017   CHOL 205 (H) 10/24/2017   TRIG 165.0 (H) 10/24/2017   HDL 50.80 10/24/2017   LDLCALC 121 (H) 10/24/2017   ALT 23 10/24/2017   AST 19 10/24/2017   NA 138 10/24/2017   K 3.8 10/24/2017   CL 102 10/24/2017   CREATININE 1.03 10/24/2017   BUN 13 10/24/2017   CO2 30 10/24/2017   TSH 1.60 03/08/2016   PSA 0.64 10/24/2017    No results found.  Assessment & Plan:   Chisum was seen today for uri and allergic rhinitis .  Diagnoses and all orders for this visit:  Seasonal and perennial allergic rhinitis- Will treat the symptoms with a topical and systemic steroids. -     triamcinolone (NASACORT) 55 MCG/ACT AERO nasal inhaler; Place 4 sprays into the nose daily. -     methylPREDNISolone (MEDROL DOSEPAK) 4 MG TBPK tablet; TAKE AS DIRECTED  Acute URI- At this time we agreed that he would not take any antibiotics.  If his symptoms do not resolve or if they worsen within the next 10 days he will let me know and we will consider antibiotic therapy.   I am having William Bauer start on triamcinolone and methylPREDNISolone. I am also having him maintain his Fluticasone-Salmeterol (ADVAIR HFA IN), cyclobenzaprine, omeprazole, vortioxetine HBr, methylphenidate, and azelastine.  Meds ordered this encounter  Medications  . triamcinolone (NASACORT) 55 MCG/ACT AERO nasal inhaler    Sig: Place 4 sprays into  the nose daily.    Dispense:  32.4 mL    Refill:  3  . methylPREDNISolone (MEDROL DOSEPAK) 4 MG TBPK tablet    Sig: TAKE AS DIRECTED    Dispense:  21 tablet    Refill:  0     Follow-up: Return in about 3 weeks (around 08/11/2018).  Sanda Linger, MD

## 2018-07-21 NOTE — Addendum Note (Signed)
Addended by: Verlan FriendsAIRRIKIER DAVIDSON, Ryota Treece M on: 07/21/2018 03:38 PM   Modules accepted: Orders

## 2018-07-21 NOTE — Patient Instructions (Signed)

## 2018-08-29 ENCOUNTER — Other Ambulatory Visit: Payer: Self-pay | Admitting: Internal Medicine

## 2018-08-29 DIAGNOSIS — F900 Attention-deficit hyperactivity disorder, predominantly inattentive type: Secondary | ICD-10-CM

## 2018-09-10 ENCOUNTER — Encounter

## 2018-09-10 ENCOUNTER — Ambulatory Visit: Payer: PRIVATE HEALTH INSURANCE | Admitting: Family

## 2018-09-10 ENCOUNTER — Ambulatory Visit (INDEPENDENT_AMBULATORY_CARE_PROVIDER_SITE_OTHER)
Admission: RE | Admit: 2018-09-10 | Discharge: 2018-09-10 | Disposition: A | Discharge status: Home / Self Care | Payer: PRIVATE HEALTH INSURANCE | Source: Ambulatory Visit | Attending: Family | Admitting: Family

## 2018-09-10 ENCOUNTER — Other Ambulatory Visit (INDEPENDENT_AMBULATORY_CARE_PROVIDER_SITE_OTHER): Payer: PRIVATE HEALTH INSURANCE

## 2018-09-10 ENCOUNTER — Encounter: Payer: Self-pay | Admitting: Family

## 2018-09-10 VITALS — BP 132/76 | HR 73 | Temp 98.2°F | Ht 71.0 in | Wt 209.6 lb

## 2018-09-10 DIAGNOSIS — M549 Dorsalgia, unspecified: Secondary | ICD-10-CM

## 2018-09-10 LAB — COMPREHENSIVE METABOLIC PANEL
ALT: 23 U/L (ref 0–53)
AST: 19 U/L (ref 0–37)
Albumin: 4 g/dL (ref 3.5–5.2)
Alkaline Phosphatase: 62 U/L (ref 39–117)
BILIRUBIN TOTAL: 0.7 mg/dL (ref 0.2–1.2)
BUN: 15 mg/dL (ref 6–23)
CALCIUM: 9.5 mg/dL (ref 8.4–10.5)
CHLORIDE: 106 meq/L (ref 96–112)
CO2: 27 meq/L (ref 19–32)
Creatinine, Ser: 1.06 mg/dL (ref 0.40–1.50)
GFR: 75.63 mL/min (ref >60.00)
Glucose, Bld: 107 mg/dL — ABNORMAL HIGH (ref 70–99)
POTASSIUM: 3.8 meq/L (ref 3.5–5.1)
Sodium: 140 mEq/L (ref 135–145)
Total Protein: 6.6 g/dL (ref 6.0–8.3)

## 2018-09-10 LAB — CBC WITH DIFFERENTIAL/PLATELET
BASOS ABS: 0 10*3/uL (ref 0.0–0.1)
BASOS PCT: 0.6 % (ref 0.0–3.0)
EOS PCT: 1.8 % (ref 0.0–5.0)
Eosinophils Absolute: 0.1 10*3/uL (ref 0.0–0.7)
HEMATOCRIT: 44.5 % (ref 39.0–52.0)
Hemoglobin: 15.1 g/dL (ref 13.0–17.0)
LYMPHS PCT: 21.9 % (ref 12.0–46.0)
Lymphs Abs: 1.6 10*3/uL (ref 0.7–4.0)
MCHC: 34 g/dL (ref 30.0–36.0)
MCV: 85.4 fl (ref 78.0–100.0)
MONOS PCT: 8.5 % (ref 3.0–12.0)
Monocytes Absolute: 0.6 10*3/uL (ref 0.1–1.0)
NEUTROS ABS: 5 10*3/uL (ref 1.4–7.7)
Neutrophils Relative %: 67.2 % (ref 43.0–77.0)
PLATELETS: 257 10*3/uL (ref 150.0–400.0)
RBC: 5.2 Mil/uL (ref 4.22–5.81)
RDW: 13 % (ref 11.5–15.5)
WBC: 7.4 10*3/uL (ref 4.0–10.5)

## 2018-09-10 NOTE — Progress Notes (Signed)
William Bauer is a 62 y.o. male with the following history as recorded in [employer]:  Patient Active Problem List   Diagnosis Date Noted  . Acute URI 07/21/2018  . Current mild episode of major depressive disorder without prior episode ([place]) 06/19/2018  . Low back pain with sciatica 10/24/2017  . ADHD (attention deficit hyperactivity disorder), inattentive type 10/10/2017  . Hx of colonic polyps 12/19/2016  . Hemorrhoids, internal, with bleeding 12/19/2016  . Chronic cough 05/31/2016  . Dysphagia 04/05/2016  . Routine general medical examination at a health care facility 01/07/2015  . Hyperlipidemia 12/24/2014  . Seasonal and perennial allergic rhinitis 11/23/2014  . Food allergy 11/23/2014  . GERD (gastroesophageal reflux disease) 11/23/2014  . Obstructive sleep apnea 11/23/2014    Current Outpatient Medications  Medication Sig Dispense Refill  . azelastine (ASTELIN) 0.1 % nasal spray Place into both nostrils 2 (two) times daily. Use in each nostril as directed    . cyclobenzaprine (FLEXERIL) 10 MG tablet Take 1 tablet (10 mg total) by mouth at bedtime. 30 tablet 2  . Fluticasone-Salmeterol (ADVAIR HFA IN) Inhale into the lungs as directed.    . methylphenidate (RITALIN) 10 MG tablet TAKE 1 TABLET BY MOUTH TWICE DAILY. 60 tablet 0  . Multiple Vitamins-Minerals (MULTIVITAMIN WITH MINERALS) tablet Take by mouth.    Marland Kitchen omeprazole (PRILOSEC) 40 MG capsule Take 1 capsule (40 mg total) by mouth daily. 90 capsule 1  . triamcinolone (NASACORT) 55 MCG/ACT AERO nasal inhaler Place 4 sprays into the nose daily. 32.4 mL 3  . vortioxetine HBr (TRINTELLIX) 10 MG TABS tablet Take 1 tablet (10 mg total) by mouth daily. 90 tablet 1   No current facility-administered medications for this visit.     Allergies: Penicillins and Yellow jacket venom [bee venom]  Past Medical History:  Diagnosis Date  . Allergy   . Arthritis    hands   . Chicken pox   . Colon polyps   . GERD (gastroesophageal  reflux disease)   . Hyperlipidemia   . Sinus trouble   . Sleep apnea     Past Surgical History:  Procedure Laterality Date  . Cartlage removed     Right knee    Family History  Problem Relation Age of Onset  . Diabetes Maternal Aunt   . Breast cancer Maternal Aunt   . Lung cancer Father   . Hyperlipidemia Father   . Peripheral Artery Disease Mother   . Macular degeneration Mother   . Arthritis Mother   . Hyperlipidemia Mother   . Stroke Mother   . Hypertension Mother   . Heart disease Maternal Grandfather   . Hyperlipidemia Maternal Grandfather   . Heart disease Paternal Grandfather   . Hyperlipidemia Paternal Grandfather   . Alcohol abuse Maternal Uncle     Social History   Tobacco Use  . Smoking status: Never Smoker  . Smokeless tobacco: Never Used  Substance Use Topics  . Alcohol use: Yes    Alcohol/week: 0.0 standard drinks    Comment: 2 drinks monthly    Subjective:  Pain in mid-back x 3 weeks; notes that he landed on his left side recently but does not think this is related; denies any cough, chest pain or shortness of breath; pain can occur on either side of his mid back; has been able to play golf without worsening symptoms- has not taken muscle relaxer; no abdominal pain, no changes in bowel habits; no recent illness; admits he is concerned because his father's  lung cancer was diagnosed because of complaint of back pain.     Objective:  Vitals:   09/10/18 1459  BP: 132/76  Pulse: 73  Temp: 98.2 F (36.8 C)  TempSrc: Oral  SpO2: 96%  Weight: 209 lb 9.6 oz (95.1 kg)  Height: _0  (1.803 m)    General: Well developed, well nourished, in no acute distress  Skin : Warm and dry.  Head: Normocephalic and atraumatic  Eyes: Sclera and conjunctiva clear; pupils round and reactive to light; extraocular movements intact  Ears: External normal; canals clear; tympanic membranes normal  Oropharynx: Pink, supple. No suspicious lesions  Neck: Supple without  thyromegaly, adenopathy  Lungs: Respirations unlabored; clear to auscultation bilaterally without wheeze, rales, rhonchi  CVS exam: normal rate and regular rhythm.  Musculoskeletal: No deformities; no active joint inflammation  Extremities: No edema, cyanosis, clubbing  Vessels: Symmetric bilaterally  Neurologic: Alert and oriented; speech intact; face symmetrical; moves all extremities well; CNII-XII intact without focal deficit   Assessment:  1. Acute bilateral back pain, unspecified back location     Plan:  ? Etiology; suspect muscular but due to length of time symptoms present and atypical presentation, feel that labs and imaging are appropriate; update CXR and thoracic X-ray today; will also update labs; patient will try using Flexeril that he already has; follow-up to be determined.    No follow-ups on file.  Orders Placed This Encounter  Procedures  . DG Chest 2 View    Standing Status:   Future    Number of Occurrences:   1    Standing Expiration Date:   11/11/2019    Order Specific Question:   Reason for Exam (SYMPTOM  OR DIAGNOSIS REQUIRED)    Answer:   chest pain    Order Specific Question:   Preferred imaging location?    Answer:   [facility]    Order Specific Question:   Radiology Contrast Protocol - do NOT remove file path    Answer:   \\[email or web address]\DXFluoroContrastProtocols.pdf  . DG Thoracic Spine 2 View    Standing Status:   Future    Number of Occurrences:   1    Standing Expiration Date:   11/11/2019    Order Specific Question:   Reason for Exam (SYMPTOM  OR DIAGNOSIS REQUIRED)    Answer:   mid- back pain    Order Specific Question:   Preferred imaging location?    Answer:   [facility]    Order Specific Question:   Radiology Contrast Protocol - do NOT remove file path    Answer:   \\[email or web address]\[email or web address]\DXFluoroContrastProtocols.pdf  . CBC w/Diff    Standing Status:   Future    Standing Expiration Date:   09/10/2019  . Comp Met  (CMET)    Standing Status:   Future    Standing Expiration Date:   09/10/2019    Requested Prescriptions    No prescriptions requested or ordered in this encounter

## 2018-09-23 ENCOUNTER — Ambulatory Visit: Payer: PRIVATE HEALTH INSURANCE | Admitting: Internal Medicine

## 2018-09-25 ENCOUNTER — Ambulatory Visit: Payer: PRIVATE HEALTH INSURANCE | Admitting: Internal Medicine

## 2018-09-25 ENCOUNTER — Encounter: Payer: Self-pay | Admitting: Internal Medicine

## 2018-09-25 VITALS — BP 136/88 | HR 79 | Temp 98.1°F | Resp 16 | Ht 71.0 in | Wt 206.2 lb

## 2018-09-25 DIAGNOSIS — J302 Other seasonal allergic rhinitis: Secondary | ICD-10-CM | POA: Diagnosis not present

## 2018-09-25 DIAGNOSIS — S22080S Wedge compression fracture of T11-T12 vertebra, sequela: Secondary | ICD-10-CM | POA: Diagnosis not present

## 2018-09-25 DIAGNOSIS — J3089 Other allergic rhinitis: Secondary | ICD-10-CM

## 2018-09-25 MED ORDER — LEVOCETIRIZINE DIHYDROCHLORIDE 5 MG PO TABS: 5.0000 mg | ORAL_TABLET | Freq: Every evening | ORAL | 1 refills | Status: DC

## 2018-09-25 MED ORDER — TRIAMCINOLONE ACETONIDE 55 MCG/ACT NA AERO: 4.0000 | INHALATION_SPRAY | Freq: Every day | NASAL | 1 refills | Status: DC

## 2018-09-25 MED ORDER — AZELASTINE HCL 0.1 % NA SOLN: 2.0000 | Freq: Two times a day (BID) | NASAL | 1 refills | Status: DC

## 2018-09-25 NOTE — Progress Notes (Signed)
Subjective:  Patient ID: William Bauer, male    DOB: [birth year]  Age: 62 y.o. MRN: [id number]  CC: Back Pain and Allergic Rhinitis    HPI William Bauer presents for a 7871-month history of intermittent lower thoracic back pain that radiates laterally.  The pain is exacerbated by coughing and sneezing.  He is getting adequate symptom relief with the occasional dose of Flexeril and anti-inflammatories.  He recently saw another provider and an x-ray showed that he had a superior endplate height loss deformity at T12.  He complains of nasal congestion, runny nose, and postnasal drip.  He also has chronic throat clearing.  He does not think he is using his inhalers and is not taking an oral antihistamine.  Outpatient Medications Prior to Visit  Medication Sig Dispense Refill  . cyclobenzaprine (FLEXERIL) 10 MG tablet Take 1 tablet (10 mg total) by mouth at bedtime. 30 tablet 2  . Fluticasone-Salmeterol (ADVAIR HFA IN) Inhale into the lungs as directed.    . methylphenidate (RITALIN) 10 MG tablet TAKE 1 TABLET BY MOUTH TWICE DAILY. 60 tablet 0  . Multiple Vitamins-Minerals (MULTIVITAMIN WITH MINERALS) tablet Take by mouth.    Marland Kitchen. omeprazole (PRILOSEC) 40 MG capsule Take 1 capsule (40 mg total) by mouth daily. 90 capsule 1  . vortioxetine HBr (TRINTELLIX) 10 MG TABS tablet Take 1 tablet (10 mg total) by mouth daily. 90 tablet 1  . azelastine (ASTELIN) 0.1 % nasal spray Place into both nostrils 2 (two) times daily. Use in each nostril as directed    . triamcinolone (NASACORT) 55 MCG/ACT AERO nasal inhaler Place 4 sprays into the nose daily. 32.4 mL 3   No facility-administered medications prior to visit.     ROS Review of Systems  Constitutional: Negative.  Negative for chills, fatigue and fever.  HENT: Positive for congestion, postnasal drip and rhinorrhea. Negative for facial swelling, nosebleeds, sinus pressure, sneezing, sore throat and trouble swallowing.   Eyes: Negative for visual  disturbance.  Respiratory: Negative for cough, chest tightness, shortness of breath and wheezing.   Cardiovascular: Negative for chest pain, palpitations and leg swelling.  Gastrointestinal: Negative for abdominal pain, constipation, diarrhea, nausea and vomiting.  Endocrine: Negative.   Genitourinary: Negative.  Negative for difficulty urinating.  Musculoskeletal: Negative.  Negative for arthralgias and myalgias.  Skin: Negative.   Neurological: Negative.  Negative for dizziness, weakness, light-headedness and headaches.  Hematological: Negative for adenopathy. Does not bruise/bleed easily.  Psychiatric/Behavioral: Negative.     Objective:  BP 136/88 (BP Location: Left Arm, Patient Position: Sitting, Cuff Size: Large)   Pulse 79   Temp 98.1 F (36.7 C) (Oral)   Resp 16   Ht 5\' 11"  (1.803 m)   Wt 206 lb 4 oz (93.6 kg)   SpO2 95%   BMI 28.77 kg/m   BP Readings from Last 3 Encounters:  09/25/18 136/88  09/10/18 132/76  07/21/18 130/80    Wt Readings from Last 3 Encounters:  09/25/18 206 lb 4 oz (93.6 kg)  09/10/18 209 lb 9.6 oz (95.1 kg)  07/21/18 207 lb 12 oz (94.2 kg)    Physical Exam  Constitutional: He is oriented to person, place, and time. No distress.  HENT:  Nose: Mucosal edema and rhinorrhea present. No epistaxis. Right sinus exhibits no maxillary sinus tenderness and no frontal sinus tenderness. Left sinus exhibits no maxillary sinus tenderness and no frontal sinus tenderness.  Mouth/Throat: No oropharyngeal exudate.  Eyes: Conjunctivae are normal. No scleral icterus.  Neck: Normal range  of motion. Neck supple. No JVD present. No thyromegaly present.  Cardiovascular: Normal rate, regular rhythm and normal heart sounds. Exam reveals no gallop.  No murmur heard. Pulmonary/Chest: Effort normal and breath sounds normal. No respiratory distress. He has no wheezes. He has no rales.  Abdominal: Soft. Bowel sounds are normal. He exhibits no mass. There is no  hepatosplenomegaly. There is no tenderness.  Musculoskeletal: Normal range of motion. He exhibits no edema, tenderness or deformity.       Thoracic back: Normal.       Lumbar back: Normal.  Lymphadenopathy:    He has no cervical adenopathy.  Neurological: He is alert and oriented to person, place, and time.  Skin: Skin is warm and dry. No rash noted. He is not diaphoretic.  Vitals reviewed.   Lab Results  Component Value Date   WBC 7.4 09/10/2018   HGB 15.1 09/10/2018   HCT 44.5 09/10/2018   PLT 257.0 09/10/2018   GLUCOSE 107 (H) 09/10/2018   CHOL 205 (H) 10/24/2017   TRIG 165.0 (H) 10/24/2017   HDL 50.80 10/24/2017   LDLCALC 121 (H) 10/24/2017   ALT 23 09/10/2018   AST 19 09/10/2018   NA 140 09/10/2018   K 3.8 09/10/2018   CL 106 09/10/2018   CREATININE 1.06 09/10/2018   BUN 15 09/10/2018   CO2 27 09/10/2018   TSH 1.60 03/08/2016   PSA 0.64 10/24/2017    Dg Chest 2 View  Result Date: 09/11/2018 CLINICAL DATA:  BILATERAL mid back pain for 3-4 weeks EXAM: CHEST - 2 VIEW COMPARISON:  None FINDINGS: Upper normal heart size. Mediastinal contours and pulmonary vascularity normal. Lungs clear. No infiltrate, pleural effusion or pneumothorax. Age-indeterminate mild superior endplate height loss anteriorly of T12 vertebral body. IMPRESSION: No acute pulmonary abnormalities. Age-indeterminate height loss of superior endplate T12 vertebral body. Electronically Signed   By: Ulyses Southward M.D.   On: 09/11/2018 09:08   Dg Thoracic Spine 2 View  Result Date: 09/11/2018 CLINICAL DATA:  BILATERAL mid back pain for 3-4 weeks, no known injury EXAM: THORACIC SPINE 2 VIEWS COMPARISON:  None FINDINGS: Twelve pairs of ribs. Osseous mineralization appears mildly decreased. Superior endplate height loss of T12 vertebral body on LEFT, estimated 25% loss, age indeterminate. Remaining vertebral body heights maintained. No additional fracture, subluxation or bone destruction. Degenerative disc disease  changes at visualized lower cervical spine. IMPRESSION: Age-indeterminate superior endplate height loss of T12 vertebral body especially on LEFT, estimated at 25% anterior loss of height. Electronically Signed   By: Ulyses Southward M.D.   On: 09/11/2018 09:10    Assessment & Plan:   Spiros was seen today for back pain and allergic rhinitis .  Diagnoses and all orders for this visit:  Seasonal and perennial allergic rhinitis- I have asked him to start using his inhaled nasal steroids and antihistamines and to start taking a systemic antihistamine as well. -     triamcinolone (NASACORT) 55 MCG/ACT AERO nasal inhaler; Place 4 sprays into the nose daily. -     azelastine (ASTELIN) 0.1 % nasal spray; Place 2 sprays into both nostrils 2 (two) times daily. Use in each nostril as directed -     levocetirizine (XYZAL) 5 MG tablet; Take 1 tablet (5 mg total) by mouth every evening.  T12 compression fracture, sequela- I have asked him to under go an MRI to be certain that there is or is not a compression deformity and to screen for pathological or malignant process. -  MR Thoracic Spine Wo Contrast; Future   I have changed Ethelene Browns Hable's azelastine. I am also having him start on levocetirizine. Additionally, I am having him maintain his Fluticasone-Salmeterol (ADVAIR HFA IN), cyclobenzaprine, omeprazole, vortioxetine HBr, methylphenidate, multivitamin with minerals, and triamcinolone.  Meds ordered this encounter  Medications  . triamcinolone (NASACORT) 55 MCG/ACT AERO nasal inhaler    Sig: Place 4 sprays into the nose daily.    Dispense:  49.5 mL    Refill:  1  . azelastine (ASTELIN) 0.1 % nasal spray    Sig: Place 2 sprays into both nostrils 2 (two) times daily. Use in each nostril as directed    Dispense:  90 mL    Refill:  1  . levocetirizine (XYZAL) 5 MG tablet    Sig: Take 1 tablet (5 mg total) by mouth every evening.    Dispense:  90 tablet    Refill:  1     Follow-up: No  follow-ups on file.  Sanda Linger, MD

## 2018-09-27 NOTE — Patient Instructions (Signed)

## 2018-10-03 ENCOUNTER — Other Ambulatory Visit: Payer: Self-pay | Admitting: Internal Medicine

## 2018-10-03 DIAGNOSIS — F900 Attention-deficit hyperactivity disorder, predominantly inattentive type: Secondary | ICD-10-CM

## 2018-10-07 ENCOUNTER — Telehealth: Payer: Self-pay

## 2018-10-07 NOTE — Telephone Encounter (Signed)
Key: [id number]

## 2018-10-16 ENCOUNTER — Ambulatory Visit
Admission: RE | Admit: 2018-10-16 | Discharge: 2018-10-16 | Disposition: A | Discharge status: Home / Self Care | Payer: PRIVATE HEALTH INSURANCE | Source: Ambulatory Visit | Attending: Internal Medicine | Admitting: Internal Medicine

## 2018-10-16 ENCOUNTER — Encounter: Payer: Self-pay | Admitting: Internal Medicine

## 2018-10-16 DIAGNOSIS — S22080S Wedge compression fracture of T11-T12 vertebra, sequela: Secondary | ICD-10-CM

## 2018-11-17 ENCOUNTER — Telehealth: Payer: Self-pay | Admitting: Internal Medicine

## 2018-11-17 NOTE — Telephone Encounter (Signed)
Copied from CRM [id number]. Topic: Quick Communication - See Telephone Encounter >> Nov 17, 2018  2:36 PM Terisa Starr wrote: CRM for notification. See Telephone encounter for: 11/17/18.  [facility] called and asked for the patient if Dr Yetta Barre would continue to write a prescription for him called " Wellbutrin XL 150 mg " this is not active in his chart and was advised to the pharmacy that he would need to make an appt.

## 2018-11-18 ENCOUNTER — Other Ambulatory Visit: Payer: Self-pay | Admitting: Internal Medicine

## 2018-11-18 DIAGNOSIS — F320 Major depressive disorder, single episode, mild: Secondary | ICD-10-CM

## 2018-11-18 DIAGNOSIS — F900 Attention-deficit hyperactivity disorder, predominantly inattentive type: Secondary | ICD-10-CM

## 2018-11-18 MED ORDER — BUPROPION HCL ER (XL) 150 MG PO TB24: 150.0000 mg | ORAL_TABLET | Freq: Every day | ORAL | 1 refills | Status: DC

## 2018-11-18 NOTE — Telephone Encounter (Signed)
Spoke to pt to confirm the information. Pt stated that the wellbutrin xl was rx'ed by his neurologist who is no longer practicing. Pt did state that he is taking the trintellix as well. I have added the wellbutrin to his list. Pt has an appt on 12/09/2018 to discuss further.

## 2018-12-09 ENCOUNTER — Encounter: Payer: PRIVATE HEALTH INSURANCE | Admitting: Internal Medicine

## 2018-12-24 ENCOUNTER — Other Ambulatory Visit (INDEPENDENT_AMBULATORY_CARE_PROVIDER_SITE_OTHER): Payer: PRIVATE HEALTH INSURANCE

## 2018-12-24 ENCOUNTER — Encounter: Payer: Self-pay | Admitting: Internal Medicine

## 2018-12-24 ENCOUNTER — Other Ambulatory Visit: Payer: Self-pay | Admitting: Internal Medicine

## 2018-12-24 ENCOUNTER — Ambulatory Visit (INDEPENDENT_AMBULATORY_CARE_PROVIDER_SITE_OTHER): Payer: PRIVATE HEALTH INSURANCE | Admitting: Internal Medicine

## 2018-12-24 VITALS — BP 136/86 | HR 65 | Temp 98.2°F | Resp 16 | Ht 71.0 in | Wt 206.0 lb

## 2018-12-24 DIAGNOSIS — I10 Essential (primary) hypertension: Secondary | ICD-10-CM

## 2018-12-24 DIAGNOSIS — F320 Major depressive disorder, single episode, mild: Secondary | ICD-10-CM | POA: Diagnosis not present

## 2018-12-24 DIAGNOSIS — E785 Hyperlipidemia, unspecified: Secondary | ICD-10-CM

## 2018-12-24 DIAGNOSIS — Z0000 Encounter for general adult medical examination without abnormal findings: Secondary | ICD-10-CM

## 2018-12-24 DIAGNOSIS — Z125 Encounter for screening for malignant neoplasm of prostate: Secondary | ICD-10-CM

## 2018-12-24 DIAGNOSIS — F900 Attention-deficit hyperactivity disorder, predominantly inattentive type: Secondary | ICD-10-CM

## 2018-12-24 LAB — COMPREHENSIVE METABOLIC PANEL
ALT: 20 U/L (ref 0–53)
AST: 22 U/L (ref 0–37)
Albumin: 4.1 g/dL (ref 3.5–5.2)
Alkaline Phosphatase: 64 U/L (ref 39–117)
BUN: 15 mg/dL (ref 6–23)
CO2: 28 meq/L (ref 19–32)
Calcium: 9.4 mg/dL (ref 8.4–10.5)
Chloride: 103 mEq/L (ref 96–112)
Creatinine, Ser: 1 mg/dL (ref 0.40–1.50)
GFR: 76.03 mL/min (ref >60.00)
GLUCOSE: 90 mg/dL (ref 70–99)
Potassium: 4.4 mEq/L (ref 3.5–5.1)
Sodium: 139 mEq/L (ref 135–145)
Total Bilirubin: 0.7 mg/dL (ref 0.2–1.2)
Total Protein: 6.5 g/dL (ref 6.0–8.3)

## 2018-12-24 LAB — CBC WITH DIFFERENTIAL/PLATELET
Basophils Absolute: 0 10*3/uL (ref 0.0–0.1)
Basophils Relative: 0.4 % (ref 0.0–3.0)
Eosinophils Absolute: 0.1 10*3/uL (ref 0.0–0.7)
Eosinophils Relative: 2.4 % (ref 0.0–5.0)
HCT: 46 % (ref 39.0–52.0)
Hemoglobin: 15.6 g/dL (ref 13.0–17.0)
LYMPHS ABS: 1.8 10*3/uL (ref 0.7–4.0)
Lymphocytes Relative: 31.8 % (ref 12.0–46.0)
MCHC: 33.9 g/dL (ref 30.0–36.0)
MCV: 84.9 fl (ref 78.0–100.0)
Monocytes Absolute: 0.5 10*3/uL (ref 0.1–1.0)
Monocytes Relative: 8.2 % (ref 3.0–12.0)
Neutro Abs: 3.2 10*3/uL (ref 1.4–7.7)
Neutrophils Relative %: 57.2 % (ref 43.0–77.0)
PLATELETS: 294 10*3/uL (ref 150.0–400.0)
RBC: 5.41 Mil/uL (ref 4.22–5.81)
RDW: 13.3 % (ref 11.5–15.5)
WBC: 5.6 10*3/uL (ref 4.0–10.5)

## 2018-12-24 LAB — LIPID PANEL
Cholesterol: 230 mg/dL — ABNORMAL HIGH (ref 0–200)
HDL: 49.9 mg/dL (ref >39.00)
LDL Cholesterol: 163 mg/dL — ABNORMAL HIGH (ref 0–99)
NonHDL: 180.34
Total CHOL/HDL Ratio: 5
Triglycerides: 88 mg/dL (ref 0.0–149.0)
VLDL: 17.6 mg/dL (ref 0.0–40.0)

## 2018-12-24 LAB — TSH: TSH: 2.02 u[IU]/mL (ref 0.35–4.50)

## 2018-12-24 LAB — PSA: PSA: 0.73 ng/mL (ref 0.10–4.00)

## 2018-12-24 MED ORDER — VORTIOXETINE HBR 20 MG PO TABS: 20.0000 mg | ORAL_TABLET | Freq: Every day | ORAL | 1 refills | Status: AC

## 2018-12-24 NOTE — Progress Notes (Signed)
Subjective:  Patient ID: William Bauer, male    DOB: 10/22/1956  Age: 63 y.o. MRN: [id number]  CC: Annual Exam; Hypertension; and Hyperlipidemia   HPI Ervan Omar presents for a CPX.  He is struggling with symptoms of depression and anxiety and wants to know if he can increase the dose of Trintellix.  He stopped taking the statin because it was causing muscle aches.  He tells me his blood pressure has been well controlled.  He is very active and denies any recent episodes of CP or DOE.   Outpatient Medications Prior to Visit  Medication Sig Dispense Refill  . azelastine (ASTELIN) 0.1 % nasal spray Place 2 sprays into both nostrils 2 (two) times daily. Use in each nostril as directed 90 mL 1  . Fluticasone-Salmeterol (ADVAIR HFA IN) Inhale into the lungs as directed.    Marland Kitchen levocetirizine (XYZAL) 5 MG tablet Take 1 tablet (5 mg total) by mouth every evening. 90 tablet 1  . omeprazole (PRILOSEC) 40 MG capsule Take 1 capsule (40 mg total) by mouth daily. 90 capsule 1  . triamcinolone (NASACORT) 55 MCG/ACT AERO nasal inhaler Place 4 sprays into the nose daily. 49.5 mL 1  . atorvastatin (LIPITOR) 20 MG tablet TAKE 1 TABLET ONCE DAILY. 90 tablet 1  . buPROPion (WELLBUTRIN XL) 150 MG 24 hr tablet Take 1 tablet (150 mg total) by mouth daily. 90 tablet 1  . cyclobenzaprine (FLEXERIL) 10 MG tablet Take 1 tablet (10 mg total) by mouth at bedtime. 30 tablet 2  . methylphenidate (RITALIN) 10 MG tablet TAKE 1 TABLET BY MOUTH TWICE DAILY. 180 tablet 0  . Multiple Vitamins-Minerals (MULTIVITAMIN WITH MINERALS) tablet Take by mouth.    . vortioxetine HBr (TRINTELLIX) 10 MG TABS tablet Take 1 tablet (10 mg total) by mouth daily. 90 tablet 1   No facility-administered medications prior to visit.     ROS Review of Systems  Constitutional: Negative for diaphoresis, fatigue and unexpected weight change.  HENT: Negative.   Eyes: Negative for visual disturbance.  Respiratory: Negative for  cough, chest tightness, shortness of breath and wheezing.   Cardiovascular: Negative for chest pain, palpitations and leg swelling.  Gastrointestinal: Negative for abdominal pain, constipation and rectal pain.  Genitourinary: Negative.  Negative for difficulty urinating and dysuria.  Musculoskeletal: Negative.  Negative for arthralgias and myalgias.  Skin: Negative.   Neurological: Negative.  Negative for dizziness, weakness and light-headedness.  Hematological: Negative for adenopathy. Does not bruise/bleed easily.  Psychiatric/Behavioral: Positive for dysphoric mood. Negative for confusion, decreased concentration, hallucinations, self-injury, sleep disturbance and suicidal ideas. The patient is nervous/anxious. The patient is not hyperactive.     Objective:  BP 136/86 (BP Location: Left Arm, Patient Position: Sitting, Cuff Size: Normal)   Pulse 65   Temp 98.2 F (36.8 C) (Oral)   Resp 16   Ht 5\' 11"  (1.803 m)   Wt 206 lb (93.4 kg)   SpO2 97%   BMI 28.73 kg/m   BP Readings from Last 3 Encounters:  12/24/18 136/86  09/25/18 136/88  09/10/18 132/76    Wt Readings from Last 3 Encounters:  12/24/18 206 lb (93.4 kg)  09/25/18 206 lb 4 oz (93.6 kg)  09/10/18 209 lb 9.6 oz (95.1 kg)    Physical Exam Vitals signs reviewed.  Constitutional:      Appearance: He is not ill-appearing or diaphoretic.  HENT:     Nose: No congestion or rhinorrhea.     Mouth/Throat:     Mouth:  Mucous membranes are moist.     Pharynx: Oropharynx is clear. No oropharyngeal exudate or posterior oropharyngeal erythema.  Eyes:     Conjunctiva/sclera: Conjunctivae normal.  Neck:     Musculoskeletal: Normal range of motion and neck supple. No muscular tenderness.  Cardiovascular:     Rate and Rhythm: Normal rate and regular rhythm.     Pulses: Normal pulses.     Heart sounds: No murmur. No gallop.      Comments: EKG ---  Sinus  Rhythm  WITHIN NORMAL LIMITS Pulmonary:     Effort: Pulmonary effort  is normal. No respiratory distress.     Breath sounds: No stridor. No wheezing, rhonchi or rales.  Abdominal:     General: Abdomen is flat. Bowel sounds are normal.     Palpations: There is no hepatomegaly, splenomegaly or mass.     Tenderness: There is no abdominal tenderness. There is no guarding.     Hernia: There is no hernia in the right inguinal area or left inguinal area.  Genitourinary:    Pubic Area: No rash.      Penis: No discharge, swelling or lesions.      Scrotum/Testes: Normal.        Right: Mass, tenderness or swelling not present.        Left: Mass, tenderness or swelling not present.     Epididymis:     Right: Normal.     Left: Normal.     Prostate: Normal. Not enlarged, not tender and no nodules present.     Rectum: Normal. Guaiac result negative. No mass, tenderness, anal fissure, external hemorrhoid or internal hemorrhoid. Normal anal tone.  Musculoskeletal: Normal range of motion.        General: No swelling.     Right lower leg: No edema.     Left lower leg: No edema.  Lymphadenopathy:     Cervical: No cervical adenopathy.     Lower Body: No right inguinal adenopathy. No left inguinal adenopathy.  Skin:    General: Skin is dry.     Coloration: Skin is not jaundiced or pale.     Findings: No erythema or rash.  Neurological:     General: No focal deficit present.     Mental Status: He is oriented to person, place, and time. Mental status is at baseline.  Psychiatric:        Attention and Perception: Attention normal.        Mood and Affect: Mood is depressed. Mood is not anxious. Affect is flat.        Speech: Speech normal.        Behavior: Behavior normal. Behavior is cooperative.        Thought Content: Thought content normal. Thought content is not paranoid. Thought content does not include homicidal or suicidal ideation.        Cognition and Memory: Cognition and memory normal.        Judgment: Judgment normal.     Lab Results  Component Value  Date   WBC 5.6 12/24/2018   HGB 15.6 12/24/2018   HCT 46.0 12/24/2018   PLT 294.0 12/24/2018   GLUCOSE 90 12/24/2018   CHOL 230 (H) 12/24/2018   TRIG 88.0 12/24/2018   HDL 49.90 12/24/2018   LDLCALC 163 (H) 12/24/2018   ALT 20 12/24/2018   AST 22 12/24/2018   NA 139 12/24/2018   K 4.4 12/24/2018   CL 103 12/24/2018   CREATININE 1.00 12/24/2018   BUN  15 12/24/2018   CO2 28 12/24/2018   TSH 2.02 12/24/2018   PSA 0.73 12/24/2018    Mr Thoracic Spine Wo Contrast  Result Date: 10/16/2018 CLINICAL DATA:  64 year old male with 3 months of mid back pain, no known injury. EXAM: MRI THORACIC SPINE WITHOUT CONTRAST TECHNIQUE: Multiplanar, multisequence MR imaging of the thoracic spine was performed. No intravenous contrast was administered. COMPARISON:  Chest and thoracic spine radiographs 09/20/2018. Limited chest CT 08/28/2016. FINDINGS: Limited cervical spine imaging: Straightening of cervical lordosis. Evidence of lower cervical disc and endplate degeneration. Thoracic spine segmentation:  Normal on the comparison radiographs. Alignment: Stable. Mild straightening of lower thoracic kyphosis. No spondylolisthesis. Vertebrae: Degenerative endplate marrow edema at T8-T9 (series 18, image 7). Underlying disc space loss and vacuum disc. Possible developing T8 inferior endplate Schmorl's node. No other marrow edema or evidence of acute osseous abnormality. Elsewhere thoracic marrow signal appears normal. Cord: Spinal cord signal is within normal limits at all visualized levels. Capacious thoracic spinal canal. Conus medullaris is partially visible at T12-L1 and appears normal. Paraspinal and other soft tissues: Negative. Disc levels: Negative except T6-T8: Minimal left subarticular disc bulge.  No stenosis. T8-T9: Disc desiccation and vacuum disc with mild circumferential disc bulge (series 20, image 26). No stenosis. T10-T11: Mild facet hypertrophy.  No stenosis. IMPRESSION: 1. Mild for age thoracic  spine degeneration, but vacuum disc at T8-T9 with associated degenerative endplate marrow edema at that level. 2. No thoracic spinal or foraminal stenosis. No other acute osseous abnormality. Electronically Signed   By: Odessa Fleming M.D.   On: 10/16/2018 10:10    Assessment & Plan:   Guner was seen today for annual exam, hypertension and hyperlipidemia.  Diagnoses and all orders for this visit:  Routine general medical examination at a health care facility- Exam completed, labs reviewed, vaccines reviewed and updated, screening for colon cancer is up-to-date, patient education material was given. -     Lipid panel; Future -     PSA; Future  Hyperlipidemia LDL goal <130- His ASCVD risk or is less than 15% so I do not recommend a statin for CV risk reduction. -     Comprehensive metabolic panel; Future -     TSH; Future  Hypertension, unspecified type- His blood pressure is adequately well controlled.  His EKG is negative for LVH or ischemia. -     EKG 12-Lead -     CBC with Differential/Platelet; Future -     Comprehensive metabolic panel; Future  Current mild episode of major depressive disorder without prior episode (HCC)- I recommended that he increase the dose of Trintellix. -     vortioxetine HBr (TRINTELLIX) 20 MG TABS tablet; Take 1 tablet (20 mg total) by mouth daily.   I have discontinued Ethelene Browns Boatman's cyclobenzaprine, vortioxetine HBr, multivitamin with minerals, atorvastatin, and buPROPion. I am also having him start on vortioxetine HBr. Additionally, I am having him maintain his Fluticasone-Salmeterol (ADVAIR HFA IN), omeprazole, triamcinolone, azelastine, and levocetirizine.  Meds ordered this encounter  Medications  . vortioxetine HBr (TRINTELLIX) 20 MG TABS tablet    Sig: Take 1 tablet (20 mg total) by mouth daily.    Dispense:  90 tablet    Refill:  1     Follow-up: Return in about 6 months (around 06/24/2019).  Sanda Linger, MD

## 2018-12-24 NOTE — Patient Instructions (Signed)

## 2019-02-27 ENCOUNTER — Other Ambulatory Visit: Payer: Self-pay | Admitting: Internal Medicine

## 2019-02-27 DIAGNOSIS — F900 Attention-deficit hyperactivity disorder, predominantly inattentive type: Secondary | ICD-10-CM

## 2019-04-01 ENCOUNTER — Ambulatory Visit: Payer: Self-pay | Admitting: *Deleted

## 2019-04-01 NOTE — Telephone Encounter (Signed)
Patient is calling to report he has had loose stools for 10 days now- patient states they are not getting better. Patient is reporting no fever- patient has been monitoring. Patient reports- no cough or respiratory symptoms. Call to office for appointment- they need to review note for scheduling due to the symptom- note forwarded for review.  Reason for Disposition . [1] MILD diarrhea (e.g., 1-3 or more stools than normal in past 24 hours) without known cause AND [2] present >  7 days  Answer Assessment - Initial Assessment Questions 1. DIARRHEA SEVERITY: "How bad is the diarrhea?" "How many extra stools have you had in the past 24 hours than normal?"    - NO DIARRHEA (SCALE 0)   - MILD (SCALE 1-3): Few loose or mushy BMs; increase of 1-3 stools over normal daily number of stools; mild increase in ostomy output.   -  MODERATE (SCALE 4-7): Increase of 4-6 stools daily over normal; moderate increase in ostomy output. * SEVERE (SCALE 8-10; OR 'WORST POSSIBLE'): Increase of 7 or more stools daily over normal; moderate increase in ostomy output; incontinence.     mild 2. ONSET: "When did the diarrhea begin?"       10 days- since last Wednesday 3. BM CONSISTENCY: "How loose or watery is the diarrhea?"      Mostly loose- 1 watery stool 4. VOMITING: "Are you also vomiting?" If so, ask: "How many times in the past 24 hours?"      no 5. ABDOMINAL PAIN: "Are you having any abdominal pain?" If yes: "What does it feel like?" (e.g., crampy, dull, intermittent, constant)      No- lower R pain for couple day- discomfort main abdominal area- dull 6. ABDOMINAL PAIN SEVERITY: If present, ask: "How bad is the pain?"  (e.g., Scale 1-10; mild, moderate, or severe)   - MILD (1-3): doesn't interfere with normal activities, abdomen soft and not tender to touch    - MODERATE (4-7): interferes with normal activities or awakens from sleep, tender to touch    - SEVERE (8-10): excruciating pain, doubled over, unable to  do any normal activities       3- constant past 3-4 days 7. ORAL INTAKE: If vomiting, "Have you been able to drink liquids?" "How much fluids have you had in the past 24 hours?"     n/a 8. HYDRATION: "Any signs of dehydration?" (e.g., dry mouth [not just dry lips], too weak to stand, dizziness, new weight loss) "When did you last urinate?"     Dry mouth, patient is drinking 4-5 glasses water per/day 9. EXPOSURE: "Have you traveled to a foreign country recently?" "Have you been exposed to anyone with diarrhea?" "Could you have eaten any food that was spoiled?"     no 10. ANTIBIOTIC USE: "Are you taking antibiotics now or have you taken antibiotics in the past 2 months?"       no 11. OTHER SYMPTOMS: "Do you have any other symptoms?" (e.g., fever, blood in stool)       Stools seem dark- patient is color deficient  12. PREGNANCY: "Is there any chance you are pregnant?" "When was your last menstrual period?"       n/a  Protocols used: DIARRHEA-A-AH

## 2019-04-01 NOTE — Telephone Encounter (Signed)
Scheduled

## 2019-04-01 NOTE — Telephone Encounter (Signed)
Virtual scheduled for tomorrow at 1pm  Can you add him for me please?

## 2019-04-02 ENCOUNTER — Encounter: Payer: Self-pay | Admitting: Internal Medicine

## 2019-04-02 ENCOUNTER — Other Ambulatory Visit (INDEPENDENT_AMBULATORY_CARE_PROVIDER_SITE_OTHER): Payer: PRIVATE HEALTH INSURANCE

## 2019-04-02 ENCOUNTER — Ambulatory Visit (INDEPENDENT_AMBULATORY_CARE_PROVIDER_SITE_OTHER): Payer: PRIVATE HEALTH INSURANCE | Admitting: Internal Medicine

## 2019-04-02 DIAGNOSIS — R1013 Epigastric pain: Secondary | ICD-10-CM | POA: Insufficient documentation

## 2019-04-02 DIAGNOSIS — K580 Irritable bowel syndrome with diarrhea: Secondary | ICD-10-CM

## 2019-04-02 DIAGNOSIS — R197 Diarrhea, unspecified: Secondary | ICD-10-CM | POA: Insufficient documentation

## 2019-04-02 LAB — CBC WITH DIFFERENTIAL/PLATELET
Basophils Absolute: 0.1 10*3/uL (ref 0.0–0.1)
Basophils Relative: 0.9 % (ref 0.0–3.0)
Eosinophils Absolute: 0.9 10*3/uL — ABNORMAL HIGH (ref 0.0–0.7)
Eosinophils Relative: 12.5 % — ABNORMAL HIGH (ref 0.0–5.0)
HCT: 45.8 % (ref 39.0–52.0)
Hemoglobin: 15.8 g/dL (ref 13.0–17.0)
Lymphocytes Relative: 29.2 % (ref 12.0–46.0)
Lymphs Abs: 2.2 10*3/uL (ref 0.7–4.0)
MCHC: 34.4 g/dL (ref 30.0–36.0)
MCV: 84.8 fl (ref 78.0–100.0)
Monocytes Absolute: 0.5 10*3/uL (ref 0.1–1.0)
Monocytes Relative: 7 % (ref 3.0–12.0)
Neutro Abs: 3.8 10*3/uL (ref 1.4–7.7)
Neutrophils Relative %: 50.4 % (ref 43.0–77.0)
Platelets: 275 10*3/uL (ref 150.0–400.0)
RBC: 5.41 Mil/uL (ref 4.22–5.81)
RDW: 13.6 % (ref 11.5–15.5)
WBC: 7.4 10*3/uL (ref 4.0–10.5)

## 2019-04-02 LAB — COMPREHENSIVE METABOLIC PANEL
ALT: 20 U/L (ref 0–53)
AST: 16 U/L (ref 0–37)
Albumin: 3.9 g/dL (ref 3.5–5.2)
Alkaline Phosphatase: 69 U/L (ref 39–117)
BUN: 15 mg/dL (ref 6–23)
CO2: 26 mEq/L (ref 19–32)
Calcium: 9.5 mg/dL (ref 8.4–10.5)
Chloride: 106 mEq/L (ref 96–112)
Creatinine, Ser: 1.07 mg/dL (ref 0.40–1.50)
GFR: 70.26 mL/min (ref >60.00)
Glucose, Bld: 83 mg/dL (ref 70–99)
Potassium: 4 mEq/L (ref 3.5–5.1)
Sodium: 140 mEq/L (ref 135–145)
Total Bilirubin: 0.6 mg/dL (ref 0.2–1.2)
Total Protein: 6.6 g/dL (ref 6.0–8.3)

## 2019-04-02 LAB — LIPASE: Lipase: 12 U/L (ref 11.0–59.0)

## 2019-04-02 LAB — AMYLASE: Amylase: 44 U/L (ref 27–131)

## 2019-04-02 LAB — SEDIMENTATION RATE: Sed Rate: 12 mm/hr (ref 0–20)

## 2019-04-02 NOTE — Progress Notes (Addendum)
Virtual Visit via Video Note  I connected with William Bauer on 04/03/19 at  1:00 PM EDT by a video enabled telemedicine application and verified that I am speaking with the correct person using two identifiers.   I discussed the limitations of evaluation and management by telemedicine and the availability of in person appointments. The patient expressed understanding and agreed to proceed.  History of Present Illness: He checked in for a virtual visit.  He was not willing to come in for an in person visit due to the COVID-19 pandemic.  He complains of a 10-day history of diarrhea and a dull ache in his epigastric region.  He describes the diarrhea as having a couple of loose stools every other day.  He says the diarrhea is watery and gassy.  He has not seen any blood or melena.  He denies fever, chills, loss of appetite, weight loss, nausea, vomiting, or trouble swallowing. He takes a PPI but he does not take NSAIDs, smoke cigarettes, or drink alcohol.  He has not taken anything to control the diarrhea.  He has not had any symptoms while sleeping.    Observations/Objective: Healthy male.  Good color.  No distress.  Calm, cooperative, and appropriate.  Lab Results  Component Value Date   WBC 7.4 04/02/2019   HGB 15.8 04/02/2019   HCT 45.8 04/02/2019   PLT 275.0 04/02/2019   GLUCOSE 83 04/02/2019   CHOL 230 (H) 12/24/2018   TRIG 88.0 12/24/2018   HDL 49.90 12/24/2018   LDLCALC 163 (H) 12/24/2018   ALT 20 04/02/2019   AST 16 04/02/2019   NA 140 04/02/2019   K 4.0 04/02/2019   CL 106 04/02/2019   CREATININE 1.07 04/02/2019   BUN 15 04/02/2019   CO2 26 04/02/2019   TSH 2.02 12/24/2018   PSA 0.73 12/24/2018     Assessment and Plan: He has a 10-day history of abdominal pain and intermittent watery diarrhea.  His CBC, CMP, sed rate, amylase, and lipase are all normal.  His stool studies to screen for enteric pathogens are pending.  He is getting ready to travel to [place] so I  think it would be prudent to treat him for IBS with diarrhea or small intestinal bacterial overgrowth.  I have asked him to take a 2-week course of Xifaxan.  I have also asked him to use Imodium as needed.  He will avoid dairy products and raw vegetables.   Follow Up Instructions: I will let him know of the results on the stool testing when they are available.  He will let me know if he develops any new or worsening symptoms.    I discussed the assessment and treatment plan with the patient. The patient was provided an opportunity to ask questions and all were answered. The patient agreed with the plan and demonstrated an understanding of the instructions.   The patient was advised to call back or seek an in-person evaluation if the symptoms worsen or if the condition fails to improve as anticipated.  I provided 25 minutes of non-face-to-face time during this encounter.   Sanda Linger, MD

## 2019-04-03 ENCOUNTER — Encounter: Payer: Self-pay | Admitting: Internal Medicine

## 2019-04-03 ENCOUNTER — Other Ambulatory Visit: Payer: Self-pay | Admitting: Internal Medicine

## 2019-04-03 ENCOUNTER — Other Ambulatory Visit: Payer: PRIVATE HEALTH INSURANCE

## 2019-04-03 DIAGNOSIS — K580 Irritable bowel syndrome with diarrhea: Secondary | ICD-10-CM | POA: Insufficient documentation

## 2019-04-03 DIAGNOSIS — R1013 Epigastric pain: Secondary | ICD-10-CM

## 2019-04-03 DIAGNOSIS — R197 Diarrhea, unspecified: Secondary | ICD-10-CM

## 2019-04-03 MED ORDER — RIFAXIMIN 550 MG PO TABS: 550.0000 mg | ORAL_TABLET | Freq: Three times a day (TID) | ORAL | 0 refills | Status: DC

## 2019-04-03 MED ORDER — RIFAXIMIN 550 MG PO TABS: 550.0000 mg | ORAL_TABLET | Freq: Three times a day (TID) | ORAL | 0 refills | Status: AC

## 2019-04-05 LAB — CLOSTRIDIUM DIFFICILE EIA: C difficile Toxins A+B, EIA: NEGATIVE

## 2019-04-07 ENCOUNTER — Encounter: Payer: Self-pay | Admitting: Internal Medicine

## 2019-04-08 ENCOUNTER — Encounter: Payer: Self-pay | Admitting: Internal Medicine

## 2019-04-09 LAB — GIARDIA/CRYPTOSPORIDIUM (EIA)
MICRO NUMBER:: 518782
MICRO NUMBER:: 518783
RESULT:: NOT DETECTED
RESULT:: NOT DETECTED
SPECIMEN QUALITY:: ADEQUATE
SPECIMEN QUALITY:: ADEQUATE

## 2019-04-09 LAB — OVA AND PARASITE EXAMINATION
CONCENTRATE RESULT:: NONE SEEN
MICRO NUMBER:: 518784
SPECIMEN QUALITY:: ADEQUATE
TRICHROME RESULT:: NONE SEEN

## 2019-04-09 LAB — FECAL LACTOFERRIN, QUANT
Fecal Lactoferrin: NEGATIVE
MICRO NUMBER:: 518811
SPECIMEN QUALITY:: ADEQUATE

## 2019-04-09 LAB — GASTROINTESTINAL PATHOGEN PANEL PCR
C. difficile Tox A/B, PCR: NOT DETECTED
Campylobacter, PCR: NOT DETECTED
Cryptosporidium, PCR: NOT DETECTED
E coli (ETEC) LT/ST PCR: NOT DETECTED
E coli (STEC) stx1/stx2, PCR: NOT DETECTED
E coli 0157, PCR: NOT DETECTED
Giardia lamblia, PCR: NOT DETECTED
Norovirus, PCR: NOT DETECTED
Rotavirus A, PCR: NOT DETECTED
Salmonella, PCR: NOT DETECTED
Shigella, PCR: NOT DETECTED

## 2019-04-22 ENCOUNTER — Other Ambulatory Visit: Payer: Self-pay | Admitting: Internal Medicine

## 2019-04-22 DIAGNOSIS — F900 Attention-deficit hyperactivity disorder, predominantly inattentive type: Secondary | ICD-10-CM

## 2019-04-24 ENCOUNTER — Other Ambulatory Visit: Payer: Self-pay | Admitting: Internal Medicine

## 2019-04-24 DIAGNOSIS — F900 Attention-deficit hyperactivity disorder, predominantly inattentive type: Secondary | ICD-10-CM

## 2019-05-11 ENCOUNTER — Other Ambulatory Visit: Payer: Self-pay

## 2019-05-11 ENCOUNTER — Encounter: Payer: Self-pay | Admitting: Internal Medicine

## 2019-05-11 ENCOUNTER — Ambulatory Visit (INDEPENDENT_AMBULATORY_CARE_PROVIDER_SITE_OTHER): Payer: PRIVATE HEALTH INSURANCE | Admitting: Internal Medicine

## 2019-05-11 ENCOUNTER — Other Ambulatory Visit: Payer: Self-pay | Admitting: Internal Medicine

## 2019-05-11 ENCOUNTER — Other Ambulatory Visit (INDEPENDENT_AMBULATORY_CARE_PROVIDER_SITE_OTHER): Payer: PRIVATE HEALTH INSURANCE

## 2019-05-11 VITALS — BP 154/98 | HR 72 | Temp 98.2°F | Resp 16 | Ht 71.0 in | Wt 211.0 lb

## 2019-05-11 DIAGNOSIS — R1310 Dysphagia, unspecified: Secondary | ICD-10-CM | POA: Diagnosis not present

## 2019-05-11 DIAGNOSIS — I10 Essential (primary) hypertension: Secondary | ICD-10-CM

## 2019-05-11 DIAGNOSIS — R1319 Other dysphagia: Secondary | ICD-10-CM

## 2019-05-11 DIAGNOSIS — K529 Noninfective gastroenteritis and colitis, unspecified: Secondary | ICD-10-CM

## 2019-05-11 LAB — CBC WITH DIFFERENTIAL/PLATELET
Basophils Absolute: 0 10*3/uL (ref 0.0–0.1)
Basophils Relative: 0.5 % (ref 0.0–3.0)
Eosinophils Absolute: 0.4 10*3/uL (ref 0.0–0.7)
Eosinophils Relative: 6.4 % — ABNORMAL HIGH (ref 0.0–5.0)
HCT: 47.8 % (ref 39.0–52.0)
Hemoglobin: 16 g/dL (ref 13.0–17.0)
Lymphocytes Relative: 29.1 % (ref 12.0–46.0)
Lymphs Abs: 2 10*3/uL (ref 0.7–4.0)
MCHC: 33.4 g/dL (ref 30.0–36.0)
MCV: 86 fl (ref 78.0–100.0)
Monocytes Absolute: 0.5 10*3/uL (ref 0.1–1.0)
Monocytes Relative: 7.4 % (ref 3.0–12.0)
Neutro Abs: 3.9 10*3/uL (ref 1.4–7.7)
Neutrophils Relative %: 56.6 % (ref 43.0–77.0)
Platelets: 268 10*3/uL (ref 150.0–400.0)
RBC: 5.56 Mil/uL (ref 4.22–5.81)
RDW: 13.5 % (ref 11.5–15.5)
WBC: 6.8 10*3/uL (ref 4.0–10.5)

## 2019-05-11 LAB — URINALYSIS, ROUTINE W REFLEX MICROSCOPIC
Bilirubin Urine: NEGATIVE
Hgb urine dipstick: NEGATIVE
Ketones, ur: NEGATIVE
Leukocytes,Ua: NEGATIVE
Nitrite: NEGATIVE
RBC / HPF: NONE SEEN (ref 0–?)
Specific Gravity, Urine: 1.02 (ref 1.000–1.030)
Total Protein, Urine: NEGATIVE
Urine Glucose: NEGATIVE
Urobilinogen, UA: 0.2 (ref 0.0–1.0)
pH: 6 (ref 5.0–8.0)

## 2019-05-11 LAB — TSH: TSH: 1.58 u[IU]/mL (ref 0.35–4.50)

## 2019-05-11 LAB — BASIC METABOLIC PANEL
BUN: 15 mg/dL (ref 6–23)
CO2: 29 mEq/L (ref 19–32)
Calcium: 9.7 mg/dL (ref 8.4–10.5)
Chloride: 103 mEq/L (ref 96–112)
Creatinine, Ser: 0.96 mg/dL (ref 0.40–1.50)
GFR: 79.6 mL/min (ref >60.00)
Glucose, Bld: 81 mg/dL (ref 70–99)
Potassium: 4.1 mEq/L (ref 3.5–5.1)
Sodium: 138 mEq/L (ref 135–145)

## 2019-05-11 MED ORDER — PROMETHAZINE HCL 12.5 MG PO TABS: 12.5000 mg | ORAL_TABLET | Freq: Four times a day (QID) | ORAL | 0 refills | Status: AC | PRN

## 2019-05-11 NOTE — Progress Notes (Signed)
Subjective:  Patient ID: William Bauer, male    DOB: 13-Apr-1956  Age: 63 y.o. MRN: [id number]  CC: Hypertension   HPI William Bauer presents for f/up - He complains of a 1 day history of nausea, dry heaves, frequent bowel movements, and dizziness.  He has kept down yogurt, granola, and ginger ale today.  He is under a lot of emotional stress.  He denies headache, fever, chills, abdominal pain, or rash.  He has a history of trouble swallowing and tells me he choked on a piece of meat about a week or 2 ago.  His last EGD was about 10 years ago.  Outpatient Medications Prior to Visit  Medication Sig Dispense Refill  . methylphenidate (RITALIN) 10 MG tablet TAKE 1 TABLET BY MOUTH TWICE DAILY. 60 tablet 0  . omeprazole (PRILOSEC) 40 MG capsule Take 1 capsule (40 mg total) by mouth daily. 90 capsule 1  . vortioxetine HBr (TRINTELLIX) 20 MG TABS tablet Take 1 tablet (20 mg total) by mouth daily. 90 tablet 1  . azelastine (ASTELIN) 0.1 % nasal spray Place 2 sprays into both nostrils 2 (two) times daily. Use in each nostril as directed (Patient not taking: Reported on 05/11/2019) 90 mL 1  . Fluticasone-Salmeterol (ADVAIR HFA IN) Inhale into the lungs as directed.    Marland Kitchen. levocetirizine (XYZAL) 5 MG tablet Take 1 tablet (5 mg total) by mouth every evening. (Patient not taking: Reported on 05/11/2019) 90 tablet 1  . triamcinolone (NASACORT) 55 MCG/ACT AERO nasal inhaler Place 4 sprays into the nose daily. (Patient not taking: Reported on 05/11/2019) 49.5 mL 1   No facility-administered medications prior to visit.     ROS Review of Systems  Constitutional: Positive for unexpected weight change (wt gain). Negative for appetite change, chills, diaphoresis, fatigue and fever.  HENT: Positive for trouble swallowing. Negative for sore throat.   Eyes: Negative for visual disturbance.  Respiratory: Negative.  Negative for cough, chest tightness, shortness of breath and wheezing.   Cardiovascular: Negative  for chest pain, palpitations and leg swelling.  Gastrointestinal: Positive for diarrhea and nausea. Negative for abdominal pain, anal bleeding, blood in stool, constipation, rectal pain and vomiting.  Genitourinary: Negative.  Negative for difficulty urinating, dysuria, hematuria and urgency.  Musculoskeletal: Negative.  Negative for arthralgias and myalgias.  Skin: Negative.  Negative for color change and rash.  Neurological: Positive for dizziness. Negative for weakness, light-headedness and headaches.  Hematological: Negative for adenopathy. Does not bruise/bleed easily.  Psychiatric/Behavioral: Negative.     Objective:  BP (!) 154/98 (BP Location: Left Arm, Patient Position: Sitting, Cuff Size: Normal)   Pulse 72   Temp 98.2 F (36.8 C) (Oral)   Resp 16   Ht 5\' 11"  (1.803 m)   Wt 211 lb (95.7 kg)   SpO2 96%   BMI 29.43 kg/m   BP Readings from Last 3 Encounters:  05/11/19 (!) 154/98  12/24/18 136/86  09/25/18 136/88    Wt Readings from Last 3 Encounters:  05/11/19 211 lb (95.7 kg)  12/24/18 206 lb (93.4 kg)  09/25/18 206 lb 4 oz (93.6 kg)    Physical Exam Vitals signs reviewed.  Constitutional:      General: He is not in acute distress.    Appearance: He is not ill-appearing, toxic-appearing or diaphoretic.  HENT:     Nose: Nose normal. No rhinorrhea.     Mouth/Throat:     Mouth: Mucous membranes are moist.     Pharynx: No oropharyngeal exudate or  posterior oropharyngeal erythema.  Eyes:     General: No scleral icterus.    Conjunctiva/sclera: Conjunctivae normal.  Neck:     Musculoskeletal: Normal range of motion. No neck rigidity or muscular tenderness.  Cardiovascular:     Rate and Rhythm: Normal rate and regular rhythm.     Heart sounds: No murmur.  Pulmonary:     Effort: Pulmonary effort is normal.     Breath sounds: No stridor. No wheezing, rhonchi or rales.  Abdominal:     General: Abdomen is flat. Bowel sounds are normal. There is no distension.      Palpations: There is no hepatomegaly, splenomegaly or mass.     Tenderness: There is no abdominal tenderness.  Musculoskeletal: Normal range of motion.     Right lower leg: No edema.     Left lower leg: No edema.  Lymphadenopathy:     Cervical: No cervical adenopathy.  Skin:    General: Skin is warm and dry.     Findings: No rash.  Neurological:     General: No focal deficit present.  Psychiatric:        Mood and Affect: Mood normal.        Behavior: Behavior normal.     Lab Results  Component Value Date   WBC 6.8 05/11/2019   HGB 16.0 05/11/2019   HCT 47.8 05/11/2019   PLT 268.0 05/11/2019   GLUCOSE 81 05/11/2019   CHOL 230 (H) 12/24/2018   TRIG 88.0 12/24/2018   HDL 49.90 12/24/2018   LDLCALC 163 (H) 12/24/2018   ALT 20 04/02/2019   AST 16 04/02/2019   NA 138 05/11/2019   K 4.1 05/11/2019   CL 103 05/11/2019   CREATININE 0.96 05/11/2019   BUN 15 05/11/2019   CO2 29 05/11/2019   TSH 1.58 05/11/2019   PSA 0.73 12/24/2018    Mr Thoracic Spine Wo Contrast  Result Date: 10/16/2018 CLINICAL DATA:  63 year old male with 3 months of mid back pain, no known injury. EXAM: MRI THORACIC SPINE WITHOUT CONTRAST TECHNIQUE: Multiplanar, multisequence MR imaging of the thoracic spine was performed. No intravenous contrast was administered. COMPARISON:  Chest and thoracic spine radiographs 09/20/2018. Limited chest CT 08/28/2016. FINDINGS: Limited cervical spine imaging: Straightening of cervical lordosis. Evidence of lower cervical disc and endplate degeneration. Thoracic spine segmentation:  Normal on the comparison radiographs. Alignment: Stable. Mild straightening of lower thoracic kyphosis. No spondylolisthesis. Vertebrae: Degenerative endplate marrow edema at T8-T9 (series 18, image 7). Underlying disc space loss and vacuum disc. Possible developing T8 inferior endplate Schmorl's node. No other marrow edema or evidence of acute osseous abnormality. Elsewhere thoracic marrow signal  appears normal. Cord: Spinal cord signal is within normal limits at all visualized levels. Capacious thoracic spinal canal. Conus medullaris is partially visible at T12-L1 and appears normal. Paraspinal and other soft tissues: Negative. Disc levels: Negative except T6-T8: Minimal left subarticular disc bulge.  No stenosis. T8-T9: Disc desiccation and vacuum disc with mild circumferential disc bulge (series 20, image 26). No stenosis. T10-T11: Mild facet hypertrophy.  No stenosis. IMPRESSION: 1. Mild for age thoracic spine degeneration, but vacuum disc at T8-T9 with associated degenerative endplate marrow edema at that level. 2. No thoracic spinal or foraminal stenosis. No other acute osseous abnormality. Electronically Signed   By: Odessa FlemingH  Hall M.D.   On: 10/16/2018 10:10    Assessment & Plan:   William Bauer was seen today for hypertension.  Diagnoses and all orders for this visit:  Esophageal dysphagia -  Ambulatory referral to Gastroenterology  Essential hypertension- His blood pressure is not well controlled today but there are some mitigating emotional factors and an acute illness.  His labs are negative for secondary causes or endorgan damage.  He agrees to come back in 2 to 3 weeks for me to recheck his blood pressure. -     CBC with Differential/Platelet; Future -     Basic metabolic panel; Future -     TSH; Future -     Urinalysis, Routine w reflex microscopic; Future -     Basic metabolic panel; Future -     CBC with Differential/Platelet; Future -     TSH; Future -     Urinalysis, Routine w reflex microscopic; Future  Gastroenteritis, acute- His labs are negative for serious or systemic illness.  I have asked him to treat this with Phenergan as needed. -     promethazine (PHENERGAN) 12.5 MG tablet; Take 1 tablet (12.5 mg total) by mouth every 6 (six) hours as needed for nausea or vomiting. -     Cancel: Ambulatory referral to Gastroenterology   I am having William Bauer start on  promethazine. I am also having him maintain his Fluticasone-Salmeterol (ADVAIR HFA IN), omeprazole, triamcinolone, azelastine, levocetirizine, vortioxetine HBr, and methylphenidate.  Meds ordered this encounter  Medications  . promethazine (PHENERGAN) 12.5 MG tablet    Sig: Take 1 tablet (12.5 mg total) by mouth every 6 (six) hours as needed for nausea or vomiting.    Dispense:  30 tablet    Refill:  0     Follow-up: Return in about 3 weeks (around 06/01/2019).  Scarlette Calico, MD

## 2019-05-11 NOTE — Patient Instructions (Signed)

## 2019-05-12 ENCOUNTER — Encounter: Payer: Self-pay | Admitting: Internal Medicine

## 2019-07-06 ENCOUNTER — Other Ambulatory Visit: Payer: Self-pay | Admitting: Internal Medicine

## 2019-08-20 ENCOUNTER — Emergency Department
Admission: EM | Admit: 2019-08-20 | Discharge: 2019-08-20 | Disposition: A | Discharge status: Home / Self Care | Payer: PRIVATE HEALTH INSURANCE | Source: Home / Self Care

## 2019-08-20 ENCOUNTER — Other Ambulatory Visit: Payer: Self-pay

## 2019-08-20 ENCOUNTER — Encounter: Payer: Self-pay | Admitting: *Deleted

## 2019-08-20 DIAGNOSIS — S61211A Laceration without foreign body of left index finger without damage to nail, initial encounter: Secondary | ICD-10-CM | POA: Diagnosis not present

## 2019-08-20 DIAGNOSIS — Z23 Encounter for immunization: Secondary | ICD-10-CM | POA: Diagnosis not present

## 2019-08-20 MED ORDER — INFLUENZA VAC SPLIT QUAD 0.5 ML IM SUSY
0.5000 mL | PREFILLED_SYRINGE | Freq: Once | INTRAMUSCULAR | Status: AC
  Administered 2019-08-20: 20:00:00 0.5 mL via INTRAMUSCULAR

## 2019-08-20 NOTE — ED Triage Notes (Signed)
Pt c/o LT 2nd finger laceration x 2 hours ago.

## 2019-08-20 NOTE — ED Provider Notes (Signed)
[facility]    CSN: [id number] Arrival date & time: 08/20/19  1934      History   Chief Complaint Chief Complaint  Patient presents with  . Extremity Laceration    HPI William Bauer is a 63 y.o. male.   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  He lacerated the left index finger and the distal phalanx on the radial side.  Injury occurred about 2 hours ago.  Patient is having no trouble moving his finger.  Last tetanus was 2016.  Patient would like a flu shot while he is here.     Past Medical History:  Diagnosis Date  . Allergy   . Arthritis    hands   . Chicken pox   . Colon polyps   . GERD (gastroesophageal reflux disease)   . Hyperlipidemia   . Sinus trouble   . Sleep apnea     Patient Active Problem List   Diagnosis Date Noted  . Essential hypertension 05/11/2019  . Gastroenteritis, acute 05/11/2019  . Irritable bowel syndrome with diarrhea 04/03/2019  . Current mild episode of major depressive disorder without prior episode (HCC) 06/19/2018  . Low back pain with sciatica 10/24/2017  . ADHD (attention deficit hyperactivity disorder), inattentive type 10/10/2017  . Hx of colonic polyps 12/19/2016  . Hemorrhoids, internal, with bleeding 12/19/2016  . Chronic cough 05/31/2016  . Dysphagia 04/05/2016  . Routine general medical examination at a health care facility 01/07/2015  . Hyperlipidemia LDL goal <130 12/24/2014  . Seasonal and perennial allergic rhinitis 11/23/2014  . GERD (gastroesophageal reflux disease) 11/23/2014  . Obstructive sleep apnea 11/23/2014    Past Surgical History:  Procedure Laterality Date  . Cartlage removed     Right knee       Home Medications    Prior to Admission medications   Medication Sig Start Date End Date Taking? Authorizing Provider  Fluticasone-Salmeterol (ADVAIR HFA IN) Inhale into the lungs as directed.    [provider]  methylphenidate (RITALIN) 10 MG tablet TAKE 1 TABLET BY MOUTH TWICE DAILY. 04/25/19   Etta Grandchild, MD  methylphenidate (RITALIN) 5 MG tablet TAKE 1 TABLET BY MOUTH 3 TIMES A DAY 07/06/19   Etta Grandchild, MD  omeprazole (PRILOSEC) 40 MG capsule Take 1 capsule (40 mg total) by mouth daily. 06/21/18   Etta Grandchild, MD  promethazine (PHENERGAN) 12.5 MG tablet Take 1 tablet (12.5 mg total) by mouth every 6 (six) hours as needed for nausea or vomiting. 05/11/19   Etta Grandchild, MD  vortioxetine HBr (TRINTELLIX) 20 MG TABS tablet Take 1 tablet (20 mg total) by mouth daily. 12/24/18   Etta Grandchild, MD  azelastine (ASTELIN) 0.1 % nasal spray Place 2 sprays into both nostrils 2 (two) times daily. Use in each nostril as directed Patient not taking: Reported on 05/11/2019 09/25/18 08/20/19  Etta Grandchild, MD  levocetirizine (XYZAL) 5 MG tablet Take 1 tablet (5 mg total) by mouth every evening. Patient not taking: Reported on 05/11/2019 09/25/18 08/20/19  Etta Grandchild, MD    Family History Family History  Problem Relation Age of Onset  . Diabetes Maternal Aunt   . Breast cancer Maternal Aunt   . Lung cancer Father   . Hyperlipidemia Father   . Peripheral Artery Disease Mother   . Macular degeneration Mother   . Arthritis Mother   .  Hyperlipidemia Mother   . Stroke Mother   . Hypertension Mother   . Heart disease Maternal Grandfather   . Hyperlipidemia Maternal Grandfather   . Heart disease Paternal Grandfather   . Hyperlipidemia Paternal Grandfather   . Alcohol abuse Maternal Uncle     Social History Social History   Tobacco Use  . Smoking status: Never Smoker  . Smokeless tobacco: Never Used  Substance Use Topics  . Alcohol use: Yes    Alcohol/week: 0.0 standard drinks    Comment: 2 drinks monthly  . Drug use: No     Allergies   Atorvastatin, Penicillins, and Yellow jacket venom [bee venom]   Review of Systems Review of Systems  Skin: Positive for  wound.  All other systems reviewed and are negative.    Physical Exam Triage Vital Signs ED Triage Vitals  Enc Vitals Group     BP 08/20/19 1943 (!) 154/93     Pulse Rate 08/20/19 1943 90     Resp 08/20/19 1943 18     Temp --      Temp src --      SpO2 08/20/19 1943 97 %     Weight --      Height --      Head Circumference --      Peak Flow --      Pain Score 08/20/19 1942 2     Pain Loc --      Pain Edu? --      Excl. in GC? --    No data found.  Updated Vital Signs BP (!) 154/93 (BP Location: Left Arm)   Pulse 90   Resp 18   SpO2 97%    Physical Exam Vitals signs and nursing note reviewed.  Constitutional:      Appearance: Normal appearance. He is normal weight.  HENT:     Head: Normocephalic and atraumatic.  Eyes:     Conjunctiva/sclera: Conjunctivae normal.  Neck:     Musculoskeletal: Normal range of motion and neck supple.  Pulmonary:     Effort: Pulmonary effort is normal.  Musculoskeletal: Normal range of motion.        General: Signs of injury present. No swelling, tenderness or deformity.  Skin:    General: Skin is warm.  Neurological:     General: No focal deficit present.     Mental Status: He is alert.  Psychiatric:        Mood and Affect: Mood normal.   left index finger, distal phalanx on radial side, has 7 mm laceration which was thoroughly irrigated and washed with soap and water, then dermabonded.   UC Treatments / Results  Labs (all labs ordered are listed, but only abnormal results are displayed) Labs Reviewed - No data to display  EKG   Radiology No results found.  Procedures Procedures (including critical care time)  Medications Ordered in UC Medications  influenza vac split quadrivalent PF (FLUARIX) injection 0.5 mL (0.5 mLs Intramuscular Given 08/20/19 1947)    Initial Impression / Assessment and Plan / UC Course  I have reviewed the triage vital signs and the nursing notes.  Pertinent labs & imaging results that  were available during my care of the patient were reviewed by me and considered in my medical decision making (see chart for details).    Final Clinical Impressions(s) / UC Diagnoses   Final diagnoses:  Laceration of left index finger without foreign body without damage to nail, initial encounter  Discharge Instructions   None    ED Prescriptions    None     PDMP not reviewed this encounter.   Robyn Haber, MD 08/20/19 1958

## 2019-10-22 ENCOUNTER — Other Ambulatory Visit: Payer: Self-pay | Admitting: Internal Medicine

## 2020-10-23 IMAGING — DX DG THORACIC SPINE 2V
3 series · 4 of 4 positions shown · non-contrast
Comparison: None

CLINICAL DATA: BILATERAL mid back pain for 3-4 weeks, no known
injury

EXAM:
THORACIC SPINE 2 VIEWS

[Series 1: t-spine ap · 0.14mm/px · 2 of 2 slices shown]
[im 1/2]
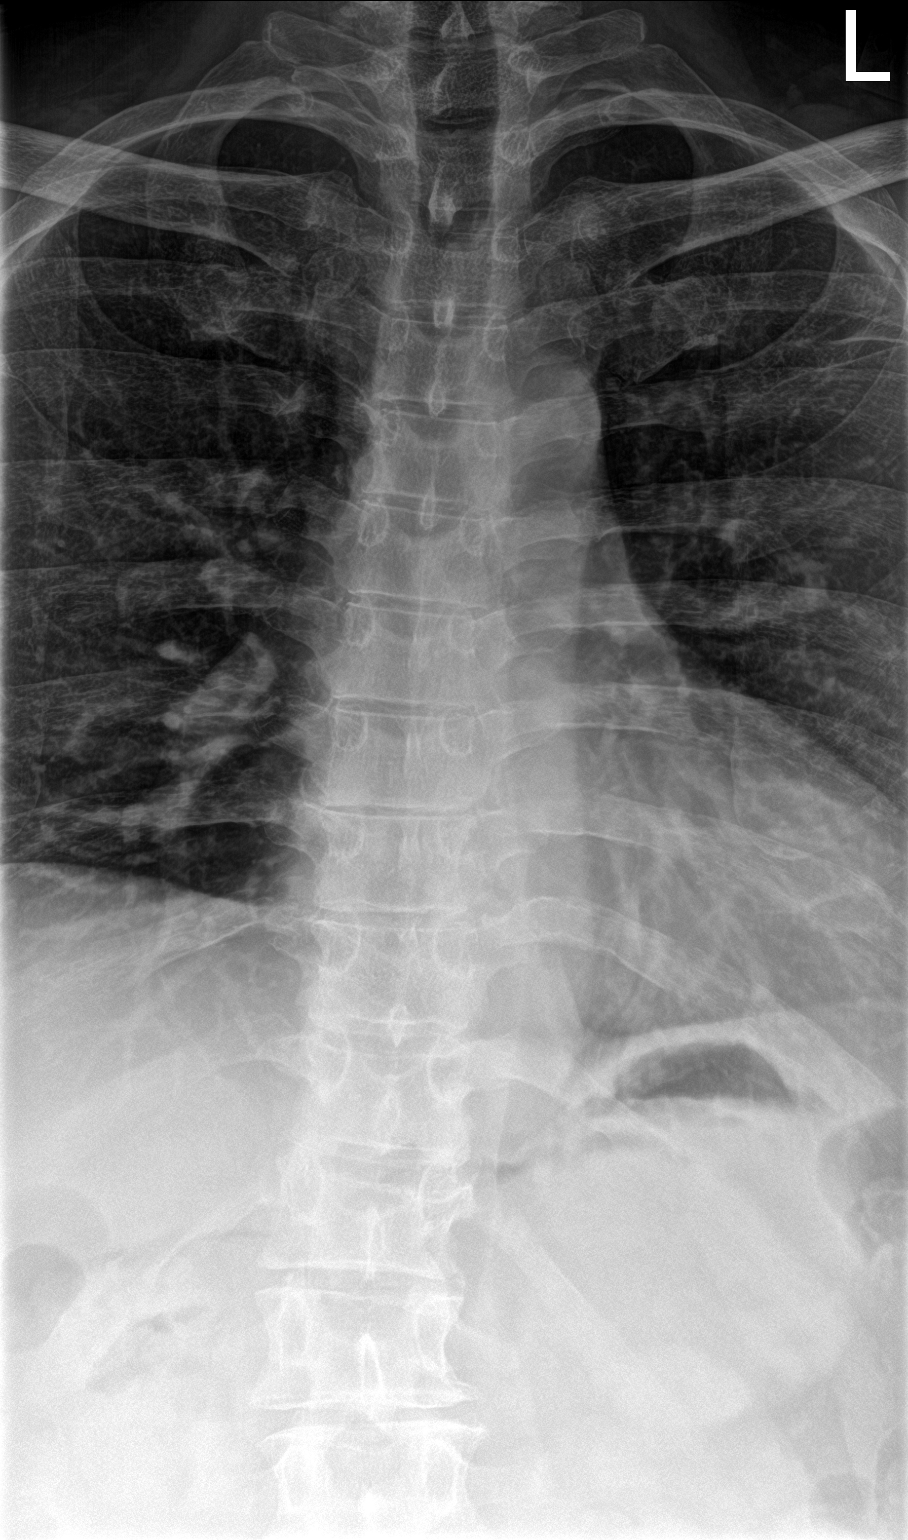
[im 2/2]
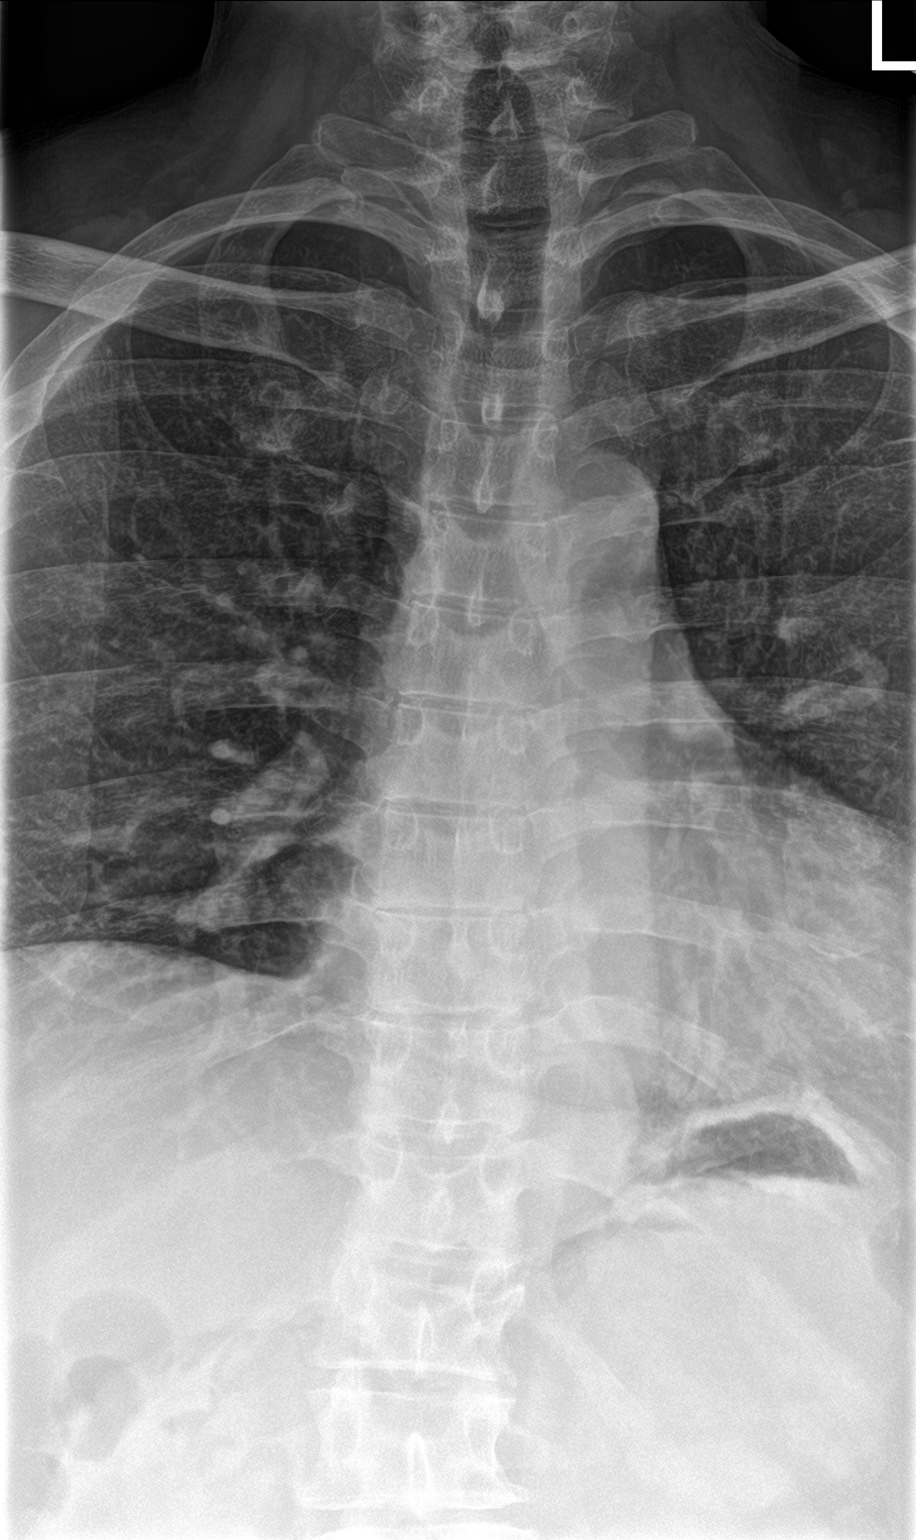

[t-spine lat]
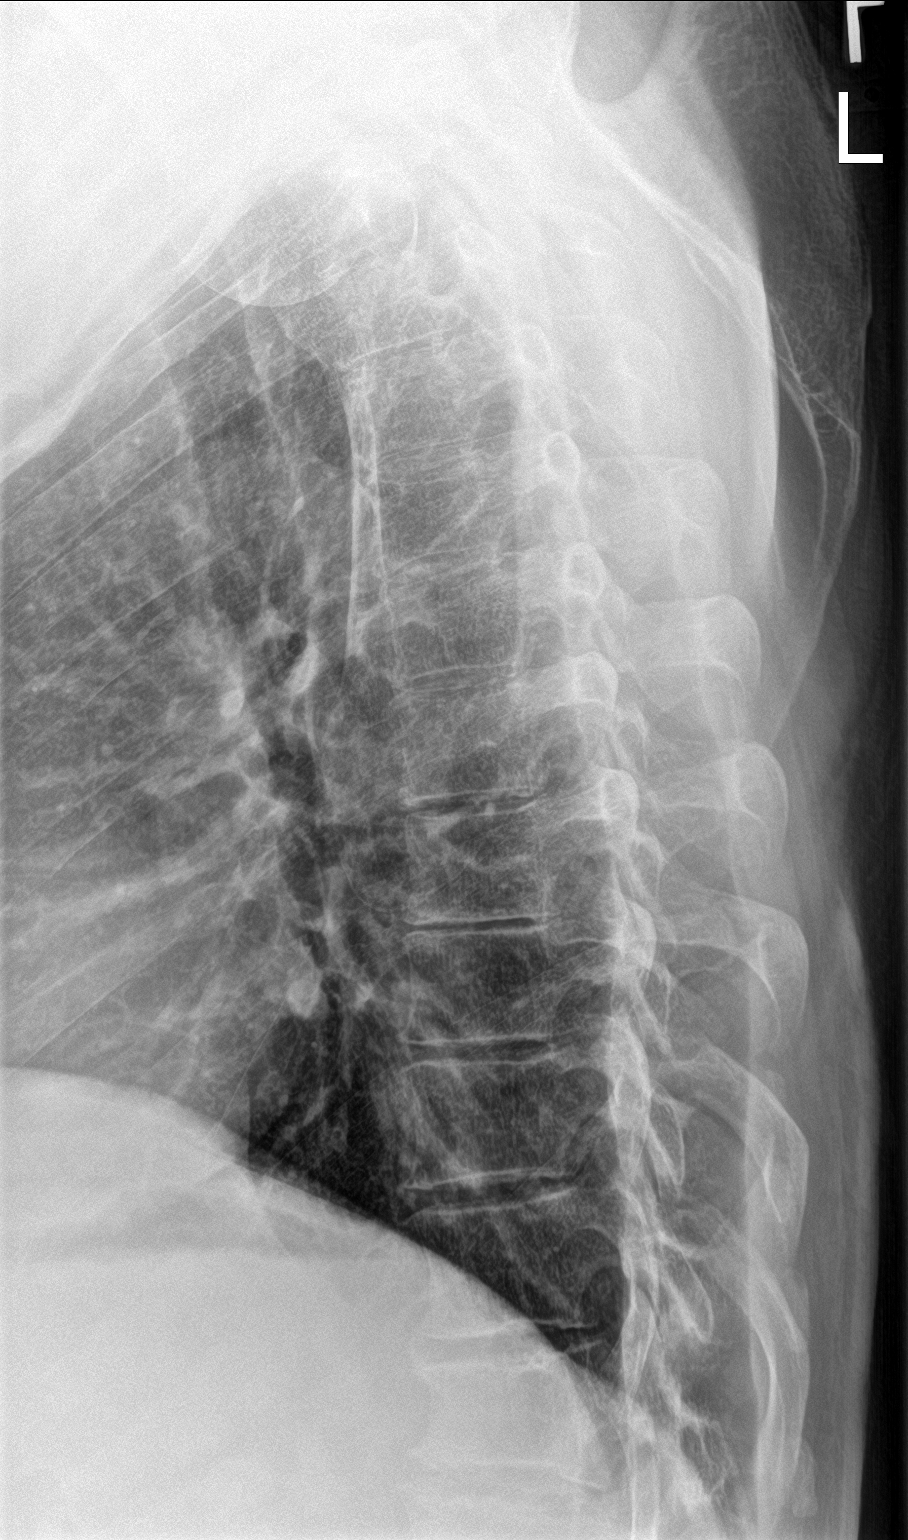

[swimmer]
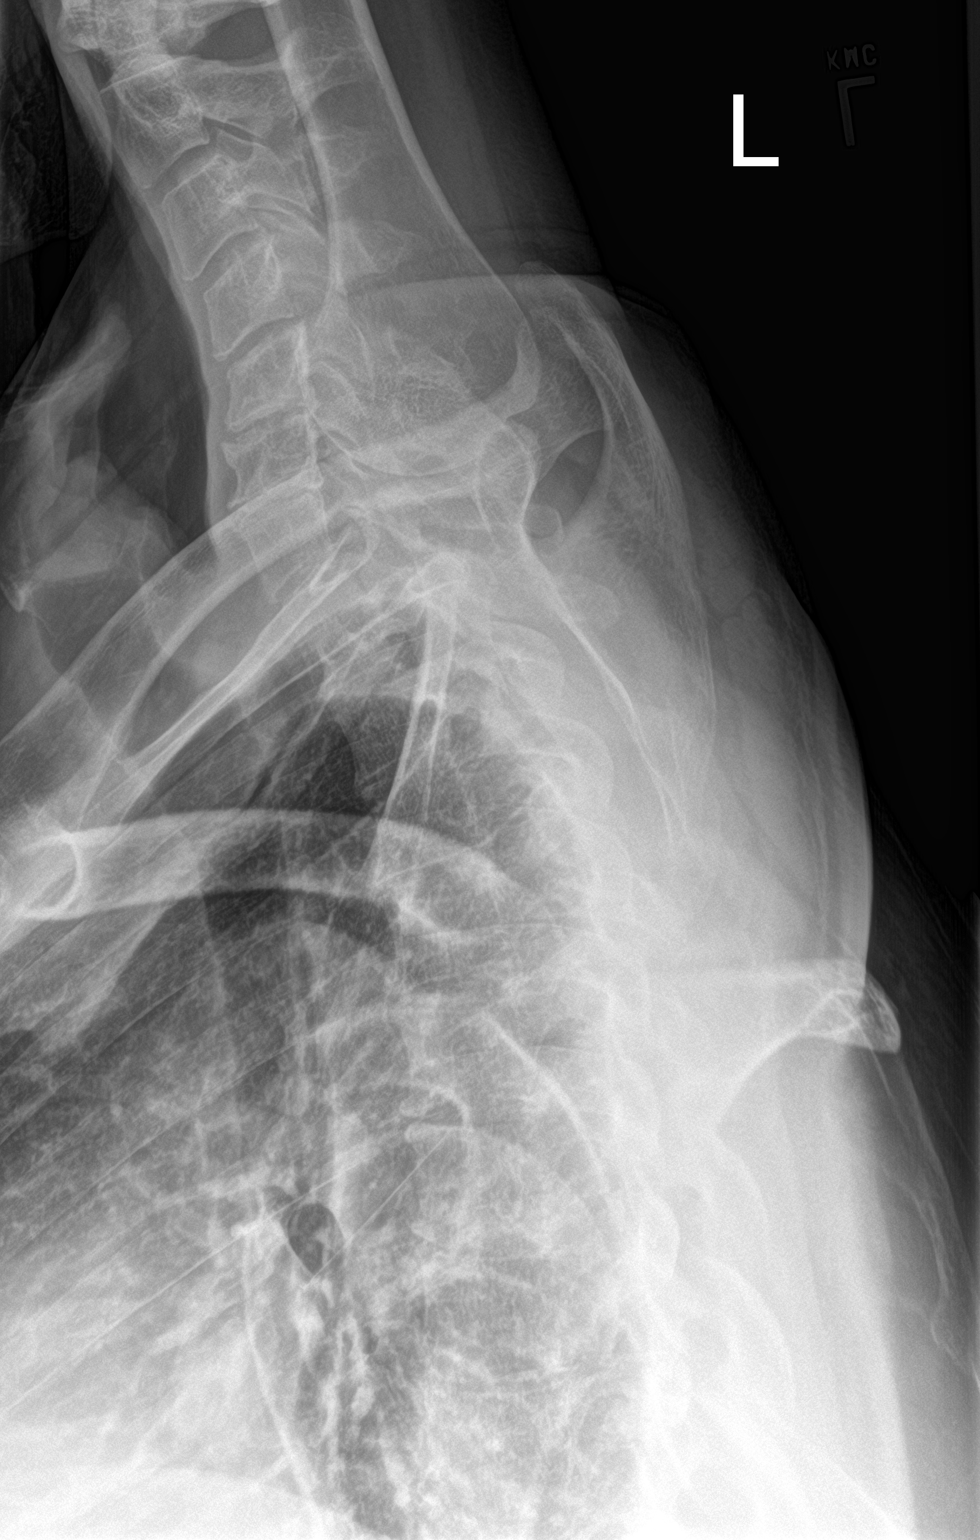

[4 of 4 positions shown; findings below may reference images not displayed]

FINDINGS: Twelve pairs of ribs.

Osseous mineralization appears mildly decreased.

Superior endplate height loss of T12 vertebral body on LEFT,
estimated 25% loss, age indeterminate.

Remaining vertebral body heights maintained.

No additional fracture, subluxation or bone destruction.

Degenerative disc disease changes at visualized lower cervical
spine.
IMPRESSION: Age-indeterminate superior endplate height loss of T12 vertebral
body especially on LEFT, estimated at 25% anterior loss of height.

## 2020-11-28 IMAGING — MR MR THORACIC SPINE W/O CM
4 of 6 series · 22 of 48 positions shown · non-contrast
Comparison: Chest and thoracic spine radiographs 09/20/2018.
Limited chest CT 08/28/2016.

CLINICAL DATA: 62-year-old male with 3 months of mid back pain, no
known injury.

EXAM:
MRI THORACIC SPINE WITHOUT CONTRAST
TECHNIQUE: Multiplanar, multisequence MR imaging of the thoracic spine was
performed. No intravenous contrast was administered.

[Series 17: T1 · sagittal · 3.0mm · 0.89mm/px · 5 of 15 slices shown]
[im 1/15]
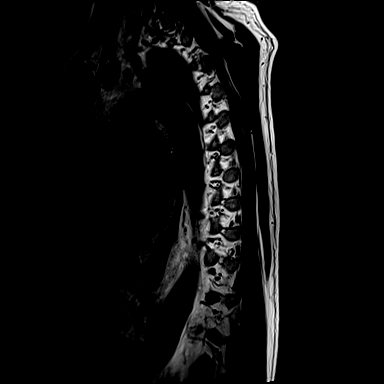
[im 4/15]
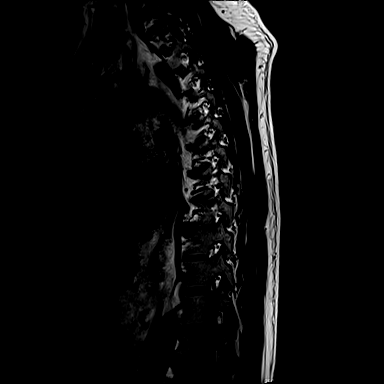
[im 8/15]
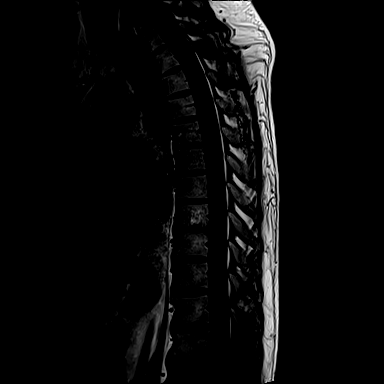
[im 11/15]
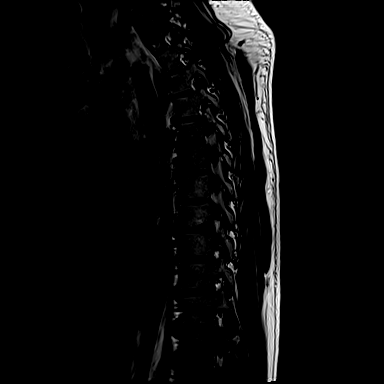
[im 15/15]
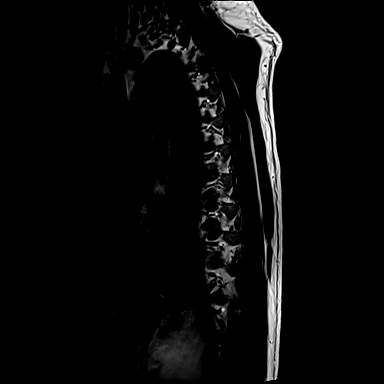

[Series 18: STIR · sagittal · 3.0mm · 1.00mm/px · 4 of 15 slices shown]
[im 1/15]
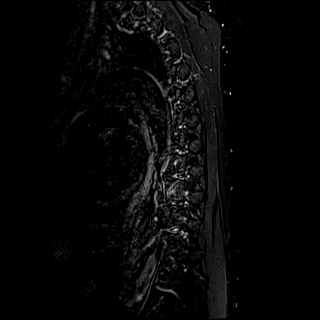
[im 4/15]
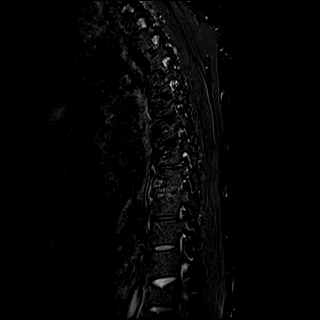
[im 8/15]
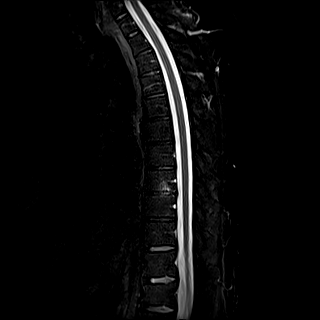
[im 15/15]
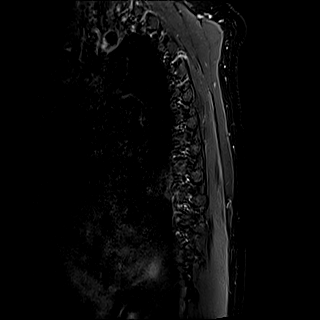

[Series 19: T2 · sagittal · 3.0mm · 0.83mm/px · 5 of 15 slices shown (1 of 2)]
[im 1/15]
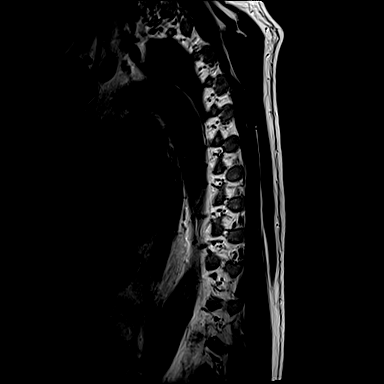
[im 4/15]
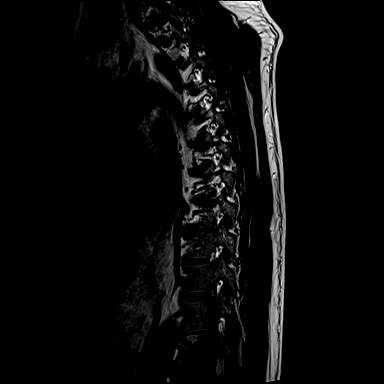
[im 8/15]
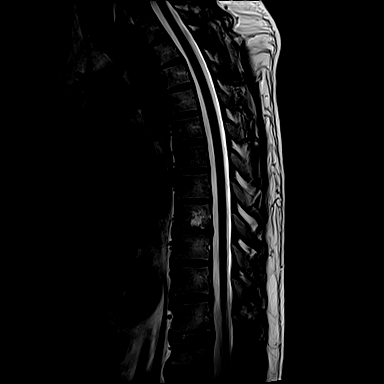
[im 11/15]
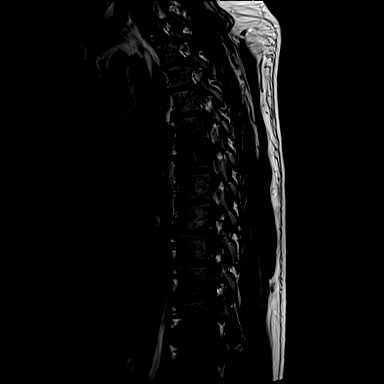
[im 15/15]
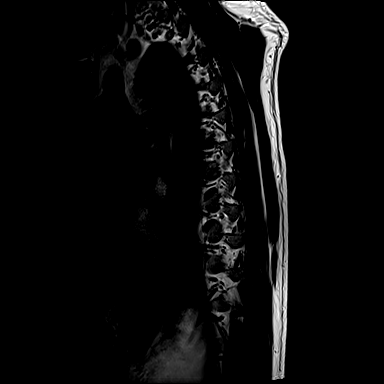

[Series 20: T2 · axial · 4.0mm · 0.28mm/px · z∈[-321,-62]mm · 8 of 44 slices shown (2 of 2)]
[im 1/44]
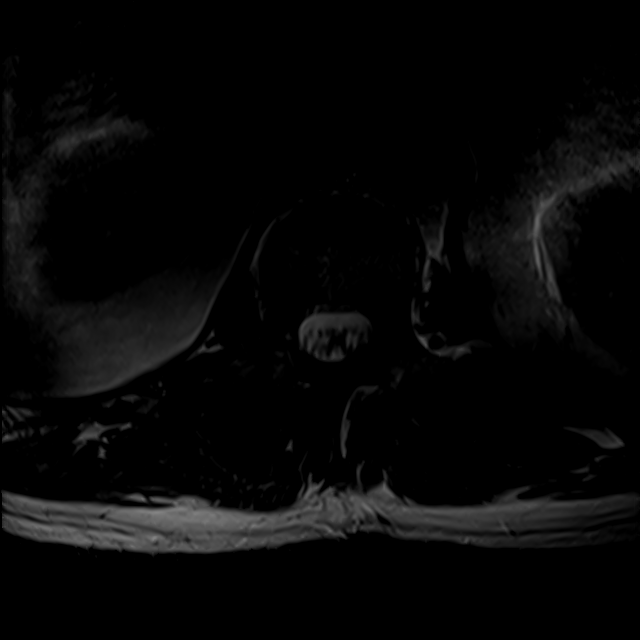
[im 7/44]
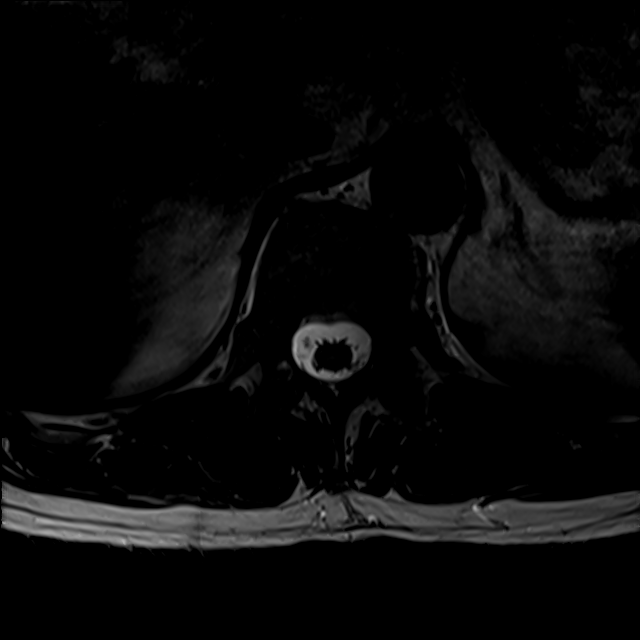
[im 14/44]
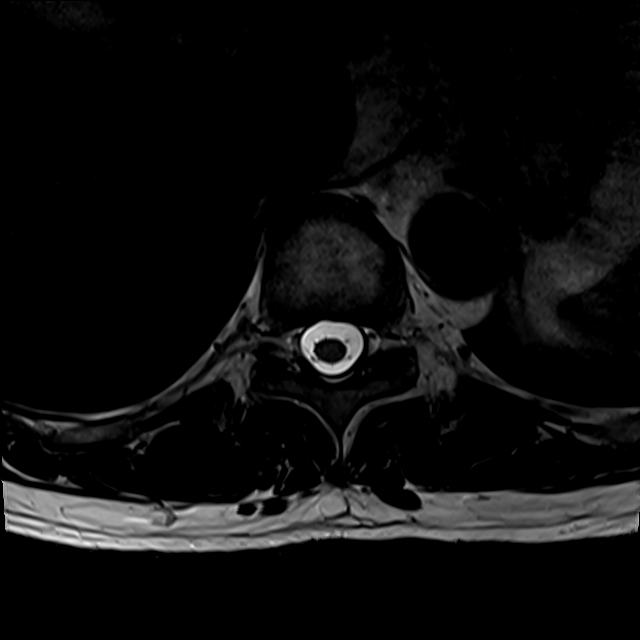
[im 20/44]
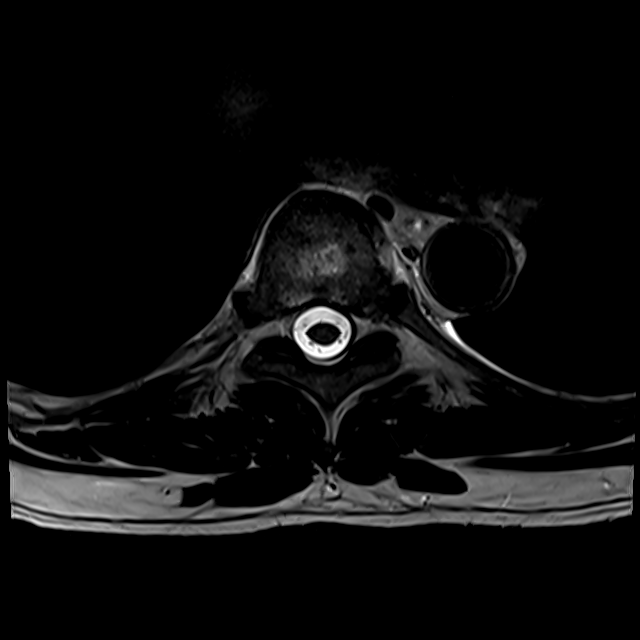
[im 24/44]
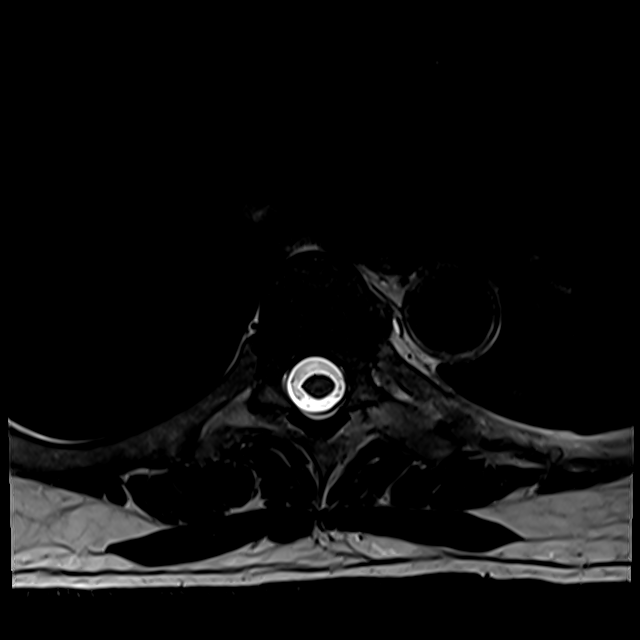
[im 30/44]
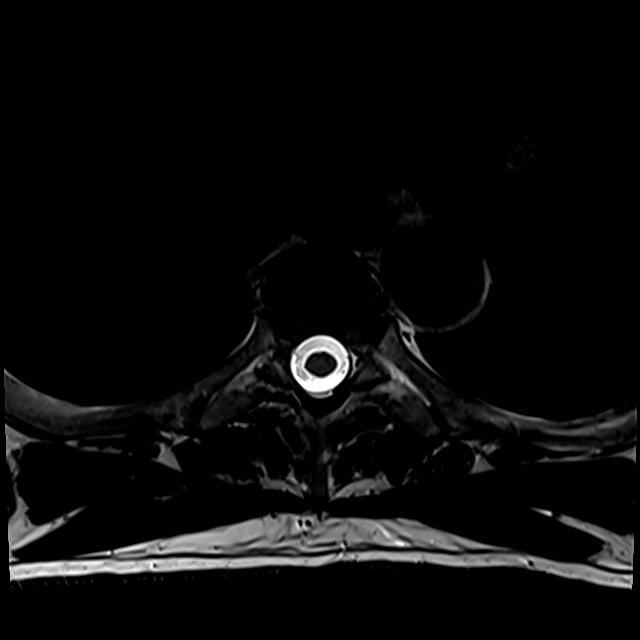
[im 37/44]
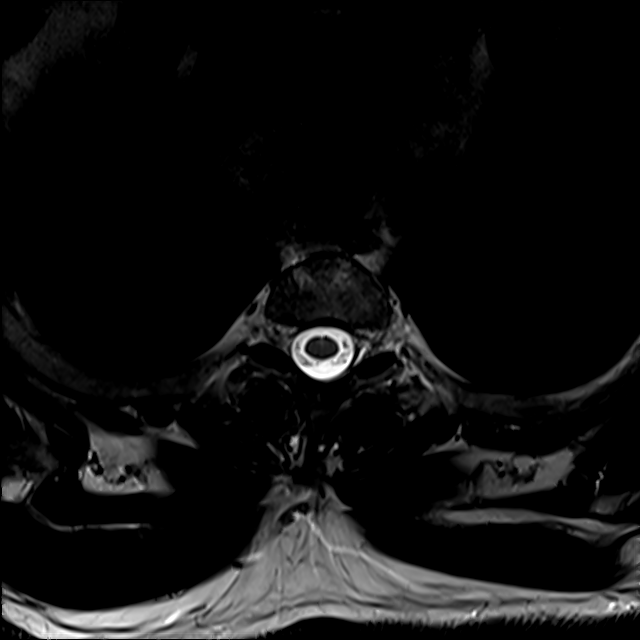
[im 44/44]
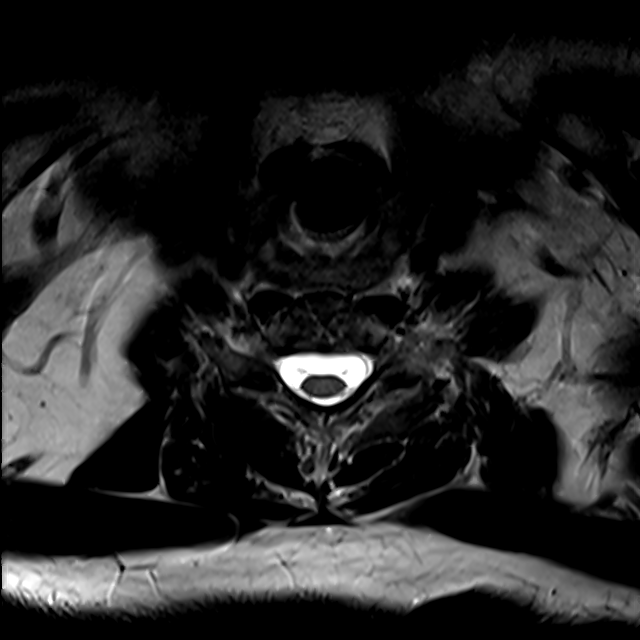

[22 of 48 positions shown; findings below may reference images not displayed]

FINDINGS: Limited cervical spine imaging: Straightening of cervical lordosis.
Evidence of lower cervical disc and endplate degeneration.

Thoracic spine segmentation:  Normal on the comparison radiographs.

Alignment: Stable. Mild straightening of lower thoracic kyphosis. No
spondylolisthesis.

Vertebrae: Degenerative endplate marrow edema at T8-T9 (series 18,
image 7). Underlying disc space loss and vacuum disc. Possible
developing T8 inferior endplate Schmorl's node.

No other marrow edema or evidence of acute osseous abnormality.
Elsewhere thoracic marrow signal appears normal.

Cord: Spinal cord signal is within normal limits at all visualized
levels. Capacious thoracic spinal canal. Conus medullaris is
partially visible at T12-L1 and appears normal.

Paraspinal and other soft tissues: Negative.

Disc levels:

Negative except

T6-T8: Minimal left subarticular disc bulge.  No stenosis.

T8-T9: Disc desiccation and vacuum disc with mild circumferential
disc bulge (series 20, image 26). No stenosis.

T10-T11: Mild facet hypertrophy.  No stenosis.
IMPRESSION: 1. Mild for age thoracic spine degeneration, but vacuum disc at
T8-T9 with associated degenerative endplate marrow edema at that
level.
2. No thoracic spinal or foraminal stenosis. No other acute osseous
abnormality.
